# Patient Record
Sex: Female | Born: 1945 | Race: White | Hispanic: No | Smoking: Current every day smoker
Health system: Southern US, Community
[De-identification: ages and names within clinical notes are randomized; demographics above are authoritative.]

## PROBLEM LIST (undated history)

## (undated) DIAGNOSIS — F329 Major depressive disorder, single episode, unspecified: Secondary | ICD-10-CM

## (undated) DIAGNOSIS — M1990 Unspecified osteoarthritis, unspecified site: Secondary | ICD-10-CM

## (undated) DIAGNOSIS — C569 Malignant neoplasm of unspecified ovary: Secondary | ICD-10-CM

## (undated) DIAGNOSIS — F32A Depression, unspecified: Secondary | ICD-10-CM

## (undated) DIAGNOSIS — E049 Nontoxic goiter, unspecified: Secondary | ICD-10-CM

## (undated) HISTORY — PX: ROTATOR CUFF REPAIR: SHX139

## (undated) HISTORY — DX: Unspecified osteoarthritis, unspecified site: M19.90

## (undated) HISTORY — PX: ANKLE FRACTURE SURGERY: SHX122

## (undated) HISTORY — DX: Major depressive disorder, single episode, unspecified: F32.9

## (undated) HISTORY — DX: Malignant neoplasm of unspecified ovary: C56.9

## (undated) HISTORY — DX: Nontoxic goiter, unspecified: E04.9

## (undated) HISTORY — PX: LUMBAR FUSION: SHX111

## (undated) HISTORY — DX: Depression, unspecified: F32.A

---

## 2005-10-21 ENCOUNTER — Emergency Department: Payer: Self-pay | Admitting: Unknown Physician Specialty

## 2005-10-22 ENCOUNTER — Ambulatory Visit: Payer: Self-pay | Admitting: Unknown Physician Specialty

## 2006-02-03 ENCOUNTER — Inpatient Hospital Stay: Payer: Self-pay | Admitting: Internal Medicine

## 2006-02-03 ENCOUNTER — Other Ambulatory Visit: Payer: Self-pay

## 2006-10-27 ENCOUNTER — Ambulatory Visit: Payer: Self-pay | Admitting: Unknown Physician Specialty

## 2006-11-29 ENCOUNTER — Ambulatory Visit: Payer: Self-pay | Admitting: Unknown Physician Specialty

## 2006-12-02 ENCOUNTER — Ambulatory Visit: Payer: Self-pay | Admitting: Unknown Physician Specialty

## 2007-01-05 ENCOUNTER — Ambulatory Visit: Payer: Self-pay | Admitting: Surgery

## 2007-01-13 ENCOUNTER — Ambulatory Visit: Payer: Self-pay | Admitting: Surgery

## 2007-01-17 ENCOUNTER — Ambulatory Visit: Payer: Self-pay | Admitting: Surgery

## 2007-02-22 ENCOUNTER — Ambulatory Visit: Payer: Self-pay | Admitting: Surgery

## 2007-02-25 ENCOUNTER — Ambulatory Visit: Payer: Self-pay | Admitting: Surgery

## 2007-04-01 ENCOUNTER — Ambulatory Visit: Payer: Self-pay | Admitting: Unknown Physician Specialty

## 2007-07-29 ENCOUNTER — Observation Stay (HOSPITAL_COMMUNITY): Admission: RE | Admit: 2007-07-29 | Discharge: 2007-07-30 | Payer: Self-pay | Admitting: Neurosurgery

## 2007-08-17 ENCOUNTER — Encounter (HOSPITAL_COMMUNITY): Admission: RE | Admit: 2007-08-17 | Discharge: 2007-09-16 | Payer: Self-pay | Admitting: Neurosurgery

## 2007-12-09 ENCOUNTER — Ambulatory Visit: Payer: Self-pay | Admitting: Otolaryngology

## 2007-12-19 ENCOUNTER — Emergency Department (HOSPITAL_COMMUNITY): Admission: EM | Admit: 2007-12-19 | Discharge: 2007-12-19 | Payer: Self-pay | Admitting: Emergency Medicine

## 2007-12-19 ENCOUNTER — Emergency Department: Payer: Self-pay | Admitting: Emergency Medicine

## 2008-10-18 ENCOUNTER — Emergency Department (HOSPITAL_COMMUNITY): Admission: EM | Admit: 2008-10-18 | Discharge: 2008-10-18 | Payer: Self-pay | Admitting: Emergency Medicine

## 2009-06-06 ENCOUNTER — Ambulatory Visit: Payer: Self-pay | Admitting: Neurology

## 2009-09-12 ENCOUNTER — Ambulatory Visit: Payer: Self-pay | Admitting: Gastroenterology

## 2010-02-02 ENCOUNTER — Emergency Department: Payer: Self-pay | Admitting: Emergency Medicine

## 2010-05-07 ENCOUNTER — Ambulatory Visit: Payer: Self-pay | Admitting: Gastroenterology

## 2010-12-21 ENCOUNTER — Emergency Department (HOSPITAL_COMMUNITY)
Admission: EM | Admit: 2010-12-21 | Discharge: 2010-12-21 | Payer: Self-pay | Source: Home / Self Care | Admitting: Emergency Medicine

## 2011-05-05 NOTE — Op Note (Signed)
[name], [name]               ACCOUNT NO.:  [id number]   MEDICAL RECORD NO.:  [id number]          PATIENT TYPE:  INP   LOCATION:  3037                         FACILITY:  [facility]   PHYSICIAN:  Donalee Citrin, M.D.        DATE OF BIRTH:  11/08/1946   DATE OF PROCEDURE:  07/29/2007  DATE OF DISCHARGE:                               OPERATIVE REPORT   PREOPERATIVE DIAGNOSIS:  Recurrent ruptured disk, L5-S1, left.   PROCEDURE:  Re-do lumbar laminectomy, microdiskectomy, L5-S1, left,  microscopic dissection left S1 nerve root, microscopic diskectomy.   SURGEON:  Donalee Citrin, M.D.   ANESTHESIA:  General endotracheal.   HPI:  The patient is a very pleasant 65 year old female with  longstanding back pain.  She has undergone previously laminectomy in the  years past, where it had recurrence, back and left leg pain radiating  down the posterior thigh to the bottom of the foot and outside of the  foot.  Preoperative imaging showed a very large recurrent ruptured disk  at L5-S1.  The patient failed all forms of conservative treatment.  Had  some weakness on plantar flexion, on heel walk and toe walk  preoperatively, and after failure of conservative treatment was  recommended laminectomy, microdiskectomy.  Risks and benefits of the  operation were explained to the patient and she understands.   PROCEDURE IN DETAIL:  The patient was brought to the OR, was induced  under general anesthesia.  The back was prepped and draped in the usual  sterile fashion.  Her old incision was opened up, with no preoperative x-  ray.  Scar tissue was dissected free and subperiosteal dissection  carried out on the residual lamina of L5 and S1.  Intraoperative x-ray  confirmed localization at the appropriate level.  Scar tissue was then  dissected off of the residual lamina, identifying the inferior aspect  lamina of L5 and superior aspect of S1.  Then using 2 and 3 mm Kerrison  punch, after a 4 pin field was freed up,  the endocervix of that lamina,  the laminotomy was extended.  Medial facetectomy was extended, exposing  virgin dura both cephalad and caudally from the previous laminotomy  defect.  At this point, the operating microscope was draped and brought  onto the field.  Under microscopic illumination, additional scar tissue  was freed up off the thecal sac.  The S1 nerve was identified, was noted  to be densely adherent to a large disk fragment contained and displaced  in the thecal sac and S1 nerve root dorsally.  Using a blunt nerve hook,  the pedicle was identified and working on the medial border of the  pedicle, the S1 nerve root was dissected free and dissected off the  large disk herniation.  Then the S1 nerve root was reflected medial with  the D'Errico.  Annulotomy was made with an 11 blade scalpel and the disk  space was directly cleaned out.  Using a downgoing Epstein and pituitary  rongeur, the disk space was cleaned out, decompressing the thecal sac  and S1 nerve root.  At  the end of the diskectomy, there was no further  stenosis, explored with a coronary dilator and hockey stick.  Then the  wound was copiously irrigated and meticulous hemostasis was maintained.  Gelfoam was overlaid on top  of the dura.  The muscle and fascia were reapproximated in layers with  interrupted Vicryl and the skin was closed with running 4-0  subcuticular.  Benzoin and Steri-Strips were applied.  The patient was  sent to the recovery room in stable condition.  At the end of the case,  sponge and needle counts were correct.           ______________________________  Donalee Citrin, M.D.     GC/MEDQ  D:  07/29/2007  T:  07/30/2007  Job:  [id number]

## 2011-10-05 LAB — CBC
Platelets: 305
RDW: 13.1

## 2011-10-05 LAB — BASIC METABOLIC PANEL
Calcium: 9.8
Sodium: 137

## 2012-01-21 ENCOUNTER — Ambulatory Visit: Payer: Self-pay

## 2012-02-02 ENCOUNTER — Ambulatory Visit: Payer: Self-pay | Admitting: Gynecologic Oncology

## 2012-02-09 ENCOUNTER — Ambulatory Visit: Payer: Self-pay | Admitting: Gynecologic Oncology

## 2012-02-19 ENCOUNTER — Ambulatory Visit: Payer: Self-pay | Admitting: Gynecologic Oncology

## 2012-03-16 ENCOUNTER — Ambulatory Visit: Payer: Self-pay | Admitting: Obstetrics and Gynecology

## 2012-03-16 DIAGNOSIS — Z01810 Encounter for preprocedural cardiovascular examination: Secondary | ICD-10-CM

## 2012-03-16 LAB — BASIC METABOLIC PANEL
BUN: 7 mg/dL (ref 7–18)
Calcium, Total: 8.9 mg/dL (ref 8.5–10.1)
Chloride: 104 mmol/L (ref 98–107)
Co2: 28 mmol/L (ref 21–32)
EGFR (African American): >60
EGFR (Non-African Amer.): >60
Osmolality: 281 (ref 275–301)
Sodium: 142 mmol/L (ref 136–145)

## 2012-03-16 LAB — HEMOGLOBIN: HGB: 13.5 g/dL (ref 12.0–16.0)

## 2012-03-21 ENCOUNTER — Ambulatory Visit: Payer: Self-pay | Admitting: Gynecologic Oncology

## 2012-03-22 ENCOUNTER — Inpatient Hospital Stay: Payer: Self-pay | Admitting: Obstetrics and Gynecology

## 2012-03-23 LAB — CBC WITH DIFFERENTIAL/PLATELET
Basophil %: 0.4 %
Eosinophil %: 0.1 %
Lymphocyte %: 15.5 %
MCV: 91 fL (ref 80–100)
Monocyte %: 11 %
Neutrophil #: 8.3 10*3/uL — ABNORMAL HIGH (ref 1.4–6.5)
Neutrophil %: 73 %

## 2012-03-23 LAB — BASIC METABOLIC PANEL
BUN: 6 mg/dL — ABNORMAL LOW (ref 7–18)
Co2: 28 mmol/L (ref 21–32)
Creatinine: 0.71 mg/dL (ref 0.60–1.30)
EGFR (African American): >60
Glucose: 102 mg/dL — ABNORMAL HIGH (ref 65–99)
Sodium: 140 mmol/L (ref 136–145)

## 2012-03-24 LAB — BASIC METABOLIC PANEL
Anion Gap: 9 (ref 7–16)
Chloride: 104 mmol/L (ref 98–107)
Co2: 29 mmol/L (ref 21–32)
EGFR (African American): >60
EGFR (Non-African Amer.): >60
Osmolality: 280 (ref 275–301)
Sodium: 142 mmol/L (ref 136–145)

## 2012-03-24 LAB — CBC WITH DIFFERENTIAL/PLATELET
Basophil %: 0.1 %
Eosinophil #: 0.1 10*3/uL (ref 0.0–0.7)
Lymphocyte #: 1.6 10*3/uL (ref 1.0–3.6)
Lymphocyte %: 15.3 %
Neutrophil #: 7.5 10*3/uL — ABNORMAL HIGH (ref 1.4–6.5)
Neutrophil %: 72.6 %

## 2012-03-25 LAB — BASIC METABOLIC PANEL
BUN: 4 mg/dL — ABNORMAL LOW (ref 7–18)
Calcium, Total: 7.8 mg/dL — ABNORMAL LOW (ref 8.5–10.1)
Calcium, Total: 7.9 mg/dL — ABNORMAL LOW (ref 8.5–10.1)
Chloride: 103 mmol/L (ref 98–107)
Co2: 30 mmol/L (ref 21–32)
Co2: 30 mmol/L (ref 21–32)
EGFR (African American): >60
EGFR (Non-African Amer.): >60
EGFR (Non-African Amer.): >60
Glucose: 114 mg/dL — ABNORMAL HIGH (ref 65–99)
Glucose: 112 mg/dL — ABNORMAL HIGH (ref 65–99)
Osmolality: 281 (ref 275–301)
Potassium: 3.1 mmol/L — ABNORMAL LOW (ref 3.5–5.1)
Potassium: 3.4 mmol/L — ABNORMAL LOW (ref 3.5–5.1)
Sodium: 142 mmol/L (ref 136–145)

## 2012-03-25 LAB — CBC WITH DIFFERENTIAL/PLATELET
Basophil %: 0.3 %
Eosinophil %: 3.2 %
HCT: 34 % — ABNORMAL LOW (ref 35.0–47.0)
HGB: 11.4 g/dL — ABNORMAL LOW (ref 12.0–16.0)
Lymphocyte #: 1 10*3/uL (ref 1.0–3.6)
Lymphocyte %: 9.4 %
MCH: 30.4 pg (ref 26.0–34.0)
MCV: 91 fL (ref 80–100)
Monocyte #: 1.1 10*3/uL — ABNORMAL HIGH (ref 0.0–0.7)
Monocyte %: 9.4 %
Neutrophil #: 8.7 10*3/uL — ABNORMAL HIGH (ref 1.4–6.5)
Platelet: 265 10*3/uL (ref 150–440)
RDW: 13.2 % (ref 11.5–14.5)
WBC: 11.2 10*3/uL — ABNORMAL HIGH (ref 3.6–11.0)

## 2012-03-26 LAB — BASIC METABOLIC PANEL
Chloride: 101 mmol/L (ref 98–107)
Co2: 32 mmol/L (ref 21–32)
Creatinine: 0.6 mg/dL (ref 0.60–1.30)
EGFR (Non-African Amer.): >60
Potassium: 3.2 mmol/L — ABNORMAL LOW (ref 3.5–5.1)

## 2012-03-26 LAB — CBC WITH DIFFERENTIAL/PLATELET
Basophil #: 0 10*3/uL (ref 0.0–0.1)
Comment - H1-Com1: NORMAL
Comment - H1-Com2: NORMAL
Eosinophil %: 4.5 %
HCT: 31.3 % — ABNORMAL LOW (ref 35.0–47.0)
HGB: 10.6 g/dL — ABNORMAL LOW (ref 12.0–16.0)
MCH: 30.5 pg (ref 26.0–34.0)
MCHC: 34.1 g/dL (ref 32.0–36.0)
Monocyte #: 0.9 10*3/uL — ABNORMAL HIGH (ref 0.0–0.7)
Platelet: 265 10*3/uL (ref 150–440)
RDW: 13.1 % (ref 11.5–14.5)
WBC: 8.2 10*3/uL (ref 3.6–11.0)

## 2012-03-26 LAB — URINE CULTURE

## 2012-03-27 LAB — MAGNESIUM: Magnesium: 1.9 mg/dL

## 2012-03-27 LAB — BASIC METABOLIC PANEL
BUN: 4 mg/dL — ABNORMAL LOW (ref 7–18)
Calcium, Total: 8.2 mg/dL — ABNORMAL LOW (ref 8.5–10.1)
Chloride: 102 mmol/L (ref 98–107)
Co2: 29 mmol/L (ref 21–32)
Creatinine: 0.55 mg/dL — ABNORMAL LOW (ref 0.60–1.30)
EGFR (Non-African Amer.): >60

## 2012-03-27 LAB — CBC WITH DIFFERENTIAL/PLATELET
Basophil %: 0.2 %
Eosinophil #: 0.4 10*3/uL (ref 0.0–0.7)
Eosinophil %: 4.5 %
HCT: 31.8 % — ABNORMAL LOW (ref 35.0–47.0)
HGB: 10.8 g/dL — ABNORMAL LOW (ref 12.0–16.0)
Lymphocyte #: 1 10*3/uL (ref 1.0–3.6)
MCH: 30.5 pg (ref 26.0–34.0)
MCV: 90 fL (ref 80–100)
Monocyte #: 1.1 10*3/uL — ABNORMAL HIGH (ref 0.0–0.7)
Monocyte %: 11.8 %
Neutrophil #: 6.7 10*3/uL — ABNORMAL HIGH (ref 1.4–6.5)
RBC: 3.54 10*6/uL — ABNORMAL LOW (ref 3.80–5.20)
WBC: 9.2 10*3/uL (ref 3.6–11.0)

## 2012-03-29 LAB — CULTURE, BLOOD (SINGLE)

## 2012-03-30 ENCOUNTER — Ambulatory Visit: Payer: Self-pay | Admitting: Gynecologic Oncology

## 2012-04-20 ENCOUNTER — Ambulatory Visit: Payer: Self-pay | Admitting: Gynecologic Oncology

## 2012-04-26 ENCOUNTER — Ambulatory Visit: Payer: Self-pay | Admitting: Gynecologic Oncology

## 2012-04-26 LAB — CBC
HCT: 36.4 % (ref 35.0–47.0)
MCH: 30.1 pg (ref 26.0–34.0)
MCHC: 34 g/dL (ref 32.0–36.0)
MCV: 89 fL (ref 80–100)
Platelet: 257 10*3/uL (ref 150–440)
RBC: 4.12 10*6/uL (ref 3.80–5.20)
RDW: 13.6 % (ref 11.5–14.5)

## 2012-04-26 LAB — BASIC METABOLIC PANEL
Calcium, Total: 9 mg/dL (ref 8.5–10.1)
Chloride: 106 mmol/L (ref 98–107)
Creatinine: 0.63 mg/dL (ref 0.60–1.30)
EGFR (African American): >60
EGFR (Non-African Amer.): >60
Glucose: 95 mg/dL (ref 65–99)
Osmolality: 279 (ref 275–301)
Sodium: 141 mmol/L (ref 136–145)

## 2012-05-05 ENCOUNTER — Ambulatory Visit: Payer: Self-pay | Admitting: Gynecologic Oncology

## 2012-05-19 LAB — COMPREHENSIVE METABOLIC PANEL
Albumin: 3.7 g/dL (ref 3.4–5.0)
Anion Gap: 8 (ref 7–16)
Bilirubin,Total: 0.6 mg/dL (ref 0.2–1.0)
Calcium, Total: 8.9 mg/dL (ref 8.5–10.1)
Co2: 30 mmol/L (ref 21–32)
Creatinine: 0.59 mg/dL — ABNORMAL LOW (ref 0.60–1.30)
EGFR (Non-African Amer.): >60
Glucose: 95 mg/dL (ref 65–99)
Osmolality: 277 (ref 275–301)
Potassium: 3.8 mmol/L (ref 3.5–5.1)
SGPT (ALT): 15 U/L

## 2012-05-19 LAB — CBC CANCER CENTER
Basophil #: 0 x10 3/mm (ref 0.0–0.1)
Eosinophil %: 1.2 %
HCT: 38 % (ref 35.0–47.0)
HGB: 12.6 g/dL (ref 12.0–16.0)
Lymphocyte #: 2.2 x10 3/mm (ref 1.0–3.6)
Lymphocyte %: 33.1 %
MCH: 29.2 pg (ref 26.0–34.0)
MCV: 88 fL (ref 80–100)
Monocyte #: 0.6 x10 3/mm (ref 0.2–0.9)
Monocyte %: 8.8 %
Neutrophil #: 3.7 x10 3/mm (ref 1.4–6.5)
Platelet: 281 x10 3/mm (ref 150–440)
RBC: 4.32 10*6/uL (ref 3.80–5.20)
RDW: 13.6 % (ref 11.5–14.5)

## 2012-05-21 ENCOUNTER — Ambulatory Visit: Payer: Self-pay | Admitting: Gynecologic Oncology

## 2012-05-26 LAB — CBC CANCER CENTER
Basophil %: 0.2 %
Eosinophil #: 0.4 x10 3/mm (ref 0.0–0.7)
HGB: 12.3 g/dL (ref 12.0–16.0)
Lymphocyte #: 1.5 x10 3/mm (ref 1.0–3.6)
Lymphocyte %: 32.6 %
MCH: 29.5 pg (ref 26.0–34.0)
MCHC: 33.6 g/dL (ref 32.0–36.0)
Monocyte %: 2.4 %
Neutrophil #: 2.7 x10 3/mm (ref 1.4–6.5)
Neutrophil %: 57.3 %
RBC: 4.17 10*6/uL (ref 3.80–5.20)
WBC: 4.7 x10 3/mm (ref 3.6–11.0)

## 2012-06-02 LAB — CBC CANCER CENTER
Basophil #: 0 x10 3/mm (ref 0.0–0.1)
Eosinophil %: 2 %
HCT: 32.9 % — ABNORMAL LOW (ref 35.0–47.0)
HGB: 11.1 g/dL — ABNORMAL LOW (ref 12.0–16.0)
Lymphocyte #: 1.5 x10 3/mm (ref 1.0–3.6)
MCH: 29 pg (ref 26.0–34.0)
MCV: 86 fL (ref 80–100)
Monocyte #: 0.4 x10 3/mm (ref 0.2–0.9)
Neutrophil %: 8.7 %
Platelet: 157 x10 3/mm (ref 150–440)
RBC: 3.84 10*6/uL (ref 3.80–5.20)
RDW: 13.1 % (ref 11.5–14.5)
WBC: 2.2 x10 3/mm — ABNORMAL LOW (ref 3.6–11.0)

## 2012-06-02 LAB — BASIC METABOLIC PANEL
Calcium, Total: 9 mg/dL (ref 8.5–10.1)
Chloride: 102 mmol/L (ref 98–107)
Co2: 30 mmol/L (ref 21–32)
Creatinine: 0.63 mg/dL (ref 0.60–1.30)
EGFR (African American): >60
EGFR (Non-African Amer.): >60
Osmolality: 276 (ref 275–301)
Potassium: 3 mmol/L — ABNORMAL LOW (ref 3.5–5.1)

## 2012-06-02 LAB — HEPATIC FUNCTION PANEL A (ARMC)
Albumin: 3.5 g/dL (ref 3.4–5.0)
Bilirubin, Direct: 0.1 mg/dL (ref 0.00–0.20)
Bilirubin,Total: 0.3 mg/dL (ref 0.2–1.0)
SGPT (ALT): 19 U/L
Total Protein: 7 g/dL (ref 6.4–8.2)

## 2012-06-16 LAB — CBC CANCER CENTER
Basophil #: 0 x10 3/mm (ref 0.0–0.1)
Basophil %: 0 %
Eosinophil #: 0 x10 3/mm (ref 0.0–0.7)
Eosinophil %: 0 %
HCT: 35 % (ref 35.0–47.0)
HGB: 11.6 g/dL — ABNORMAL LOW (ref 12.0–16.0)
Lymphocyte %: 7.4 %
MCH: 29.2 pg (ref 26.0–34.0)
MCV: 88 fL (ref 80–100)
Monocyte #: 0.1 x10 3/mm — ABNORMAL LOW (ref 0.2–0.9)
Neutrophil #: 7.6 x10 3/mm — ABNORMAL HIGH (ref 1.4–6.5)
Neutrophil %: 91.3 %
RBC: 3.99 10*6/uL (ref 3.80–5.20)
RDW: 14.6 % — ABNORMAL HIGH (ref 11.5–14.5)
WBC: 8.4 x10 3/mm (ref 3.6–11.0)

## 2012-06-16 LAB — BASIC METABOLIC PANEL
Calcium, Total: 9.3 mg/dL (ref 8.5–10.1)
Chloride: 102 mmol/L (ref 98–107)
Creatinine: 0.78 mg/dL (ref 0.60–1.30)
EGFR (African American): >60
Glucose: 117 mg/dL — ABNORMAL HIGH (ref 65–99)
Osmolality: 282 (ref 275–301)
Potassium: 3.4 mmol/L — ABNORMAL LOW (ref 3.5–5.1)

## 2012-06-20 ENCOUNTER — Ambulatory Visit: Payer: Self-pay | Admitting: Gynecologic Oncology

## 2012-06-30 LAB — CBC CANCER CENTER
Basophil %: 0.4 %
Eosinophil #: 0 x10 3/mm (ref 0.0–0.7)
Eosinophil %: 0.9 %
HCT: 30.9 % — ABNORMAL LOW (ref 35.0–47.0)
HGB: 10.6 g/dL — ABNORMAL LOW (ref 12.0–16.0)
MCH: 30.5 pg (ref 26.0–34.0)
MCHC: 34.3 g/dL (ref 32.0–36.0)
MCV: 89 fL (ref 80–100)
Monocyte #: 0.4 x10 3/mm (ref 0.2–0.9)
Neutrophil #: 0.5 x10 3/mm — ABNORMAL LOW (ref 1.4–6.5)
Neutrophil %: 16 %
RBC: 3.48 10*6/uL — ABNORMAL LOW (ref 3.80–5.20)
WBC: 3 x10 3/mm — ABNORMAL LOW (ref 3.6–11.0)

## 2012-07-07 LAB — CBC CANCER CENTER
Eosinophil #: 0 x10 3/mm (ref 0.0–0.7)
HCT: 33.6 % — ABNORMAL LOW (ref 35.0–47.0)
Lymphocyte #: 0.7 x10 3/mm — ABNORMAL LOW (ref 1.0–3.6)
Lymphocyte %: 17.3 %
MCHC: 33.8 g/dL (ref 32.0–36.0)
MCV: 90 fL (ref 80–100)
Neutrophil #: 2.9 x10 3/mm (ref 1.4–6.5)
RDW: 17 % — ABNORMAL HIGH (ref 11.5–14.5)

## 2012-07-07 LAB — COMPREHENSIVE METABOLIC PANEL
Alkaline Phosphatase: 120 U/L (ref 50–136)
BUN: 5 mg/dL — ABNORMAL LOW (ref 7–18)
Bilirubin,Total: 0.3 mg/dL (ref 0.2–1.0)
Calcium, Total: 9.2 mg/dL (ref 8.5–10.1)
Chloride: 104 mmol/L (ref 98–107)
Co2: 27 mmol/L (ref 21–32)
EGFR (African American): >60
EGFR (Non-African Amer.): >60
Glucose: 70 mg/dL (ref 65–99)
Osmolality: 279 (ref 275–301)
Potassium: 3.4 mmol/L — ABNORMAL LOW (ref 3.5–5.1)
SGPT (ALT): 13 U/L
Sodium: 142 mmol/L (ref 136–145)

## 2012-07-14 LAB — CBC CANCER CENTER
Basophil #: 0 x10 3/mm (ref 0.0–0.1)
HCT: 30.8 % — ABNORMAL LOW (ref 35.0–47.0)
Lymphocyte #: 1.4 x10 3/mm (ref 1.0–3.6)
Lymphocyte %: 49.2 %
MCHC: 35.2 g/dL (ref 32.0–36.0)
Monocyte #: 0.2 x10 3/mm (ref 0.2–0.9)
Monocyte %: 5.4 %
Neutrophil #: 1.2 x10 3/mm — ABNORMAL LOW (ref 1.4–6.5)
Neutrophil %: 43.9 %
RDW: 17.3 % — ABNORMAL HIGH (ref 11.5–14.5)
WBC: 2.8 x10 3/mm — ABNORMAL LOW (ref 3.6–11.0)

## 2012-07-21 ENCOUNTER — Ambulatory Visit: Payer: Self-pay | Admitting: Gynecologic Oncology

## 2012-07-21 LAB — CBC CANCER CENTER
Basophil #: 0 x10 3/mm (ref 0.0–0.1)
HCT: 27.7 % — ABNORMAL LOW (ref 35.0–47.0)
HGB: 9.8 g/dL — ABNORMAL LOW (ref 12.0–16.0)
Lymphocyte %: 74.3 %
MCHC: 35.3 g/dL (ref 32.0–36.0)
Monocyte %: 17.8 %
Neutrophil #: 0.2 x10 3/mm — ABNORMAL LOW (ref 1.4–6.5)
RBC: 3.03 10*6/uL — ABNORMAL LOW (ref 3.80–5.20)
RDW: 17.8 % — ABNORMAL HIGH (ref 11.5–14.5)
WBC: 2.5 x10 3/mm — ABNORMAL LOW (ref 3.6–11.0)

## 2012-07-28 LAB — CBC CANCER CENTER
Basophil %: 0.1 %
Eosinophil %: 0.2 %
HGB: 10 g/dL — ABNORMAL LOW (ref 12.0–16.0)
Lymphocyte %: 38 %
MCH: 32.6 pg (ref 26.0–34.0)
Monocyte #: 0.3 x10 3/mm (ref 0.2–0.9)
Monocyte %: 7.4 %
Neutrophil %: 54.3 %
WBC: 4.2 x10 3/mm (ref 3.6–11.0)

## 2012-08-04 LAB — CBC CANCER CENTER
Basophil #: 0 x10 3/mm (ref 0.0–0.1)
Eosinophil %: 0 %
HCT: 31.4 % — ABNORMAL LOW (ref 35.0–47.0)
HGB: 10.6 g/dL — ABNORMAL LOW (ref 12.0–16.0)
MCH: 32.5 pg (ref 26.0–34.0)
MCHC: 33.8 g/dL (ref 32.0–36.0)
MCV: 96 fL (ref 80–100)
Monocyte %: 2.6 %
RBC: 3.27 10*6/uL — ABNORMAL LOW (ref 3.80–5.20)

## 2012-08-04 LAB — COMPREHENSIVE METABOLIC PANEL
Albumin: 3.9 g/dL (ref 3.4–5.0)
BUN: 6 mg/dL — ABNORMAL LOW (ref 7–18)
Chloride: 103 mmol/L (ref 98–107)
Creatinine: 0.64 mg/dL (ref 0.60–1.30)
Potassium: 3.3 mmol/L — ABNORMAL LOW (ref 3.5–5.1)

## 2012-08-11 LAB — CBC CANCER CENTER
Basophil %: 0.2 %
Eosinophil #: 0 x10 3/mm (ref 0.0–0.7)
Lymphocyte #: 1 x10 3/mm (ref 1.0–3.6)
MCH: 33.9 pg (ref 26.0–34.0)
MCHC: 35.2 g/dL (ref 32.0–36.0)
MCV: 96 fL (ref 80–100)
Monocyte #: 0.1 x10 3/mm — ABNORMAL LOW (ref 0.2–0.9)
Neutrophil %: 56.4 %
Platelet: 256 x10 3/mm (ref 150–440)
RBC: 2.73 10*6/uL — ABNORMAL LOW (ref 3.80–5.20)

## 2012-08-18 LAB — CBC CANCER CENTER
Basophil #: 0 x10 3/mm (ref 0.0–0.1)
Eosinophil %: 0.4 %
MCH: 34.3 pg — ABNORMAL HIGH (ref 26.0–34.0)
Monocyte #: 0.5 x10 3/mm (ref 0.2–0.9)
Neutrophil %: 10.4 %
Platelet: 113 x10 3/mm — ABNORMAL LOW (ref 150–440)
RBC: 2.62 10*6/uL — ABNORMAL LOW (ref 3.80–5.20)
RDW: 19.5 % — ABNORMAL HIGH (ref 11.5–14.5)
WBC: 2.6 x10 3/mm — ABNORMAL LOW (ref 3.6–11.0)

## 2012-08-21 ENCOUNTER — Ambulatory Visit: Payer: Self-pay | Admitting: Gynecologic Oncology

## 2012-08-25 LAB — CBC CANCER CENTER
Basophil #: 0 x10 3/mm (ref 0.0–0.1)
Eosinophil %: 0.4 %
HGB: 8.7 g/dL — ABNORMAL LOW (ref 12.0–16.0)
Lymphocyte #: 2 x10 3/mm (ref 1.0–3.6)
Lymphocyte %: 43.4 %
MCH: 34.4 pg — ABNORMAL HIGH (ref 26.0–34.0)
MCHC: 33.9 g/dL (ref 32.0–36.0)
Monocyte #: 0.4 x10 3/mm (ref 0.2–0.9)
Neutrophil #: 2.2 x10 3/mm (ref 1.4–6.5)
Neutrophil %: 48.2 %
Platelet: 47 x10 3/mm — ABNORMAL LOW (ref 150–440)

## 2012-09-20 ENCOUNTER — Ambulatory Visit: Payer: Self-pay | Admitting: Gynecologic Oncology

## 2012-10-06 DIAGNOSIS — F17200 Nicotine dependence, unspecified, uncomplicated: Secondary | ICD-10-CM | POA: Insufficient documentation

## 2012-10-06 DIAGNOSIS — Z720 Tobacco use: Secondary | ICD-10-CM | POA: Insufficient documentation

## 2012-10-28 DIAGNOSIS — R0602 Shortness of breath: Secondary | ICD-10-CM | POA: Insufficient documentation

## 2012-11-03 ENCOUNTER — Other Ambulatory Visit: Payer: Self-pay | Admitting: Oncology

## 2012-11-03 LAB — CREATININE, SERUM
Creatinine: 0.61 mg/dL (ref 0.60–1.30)
EGFR (African American): >60

## 2012-11-04 ENCOUNTER — Ambulatory Visit: Payer: Self-pay | Admitting: Oncology

## 2012-11-07 ENCOUNTER — Ambulatory Visit: Payer: Self-pay | Admitting: Oncology

## 2012-11-07 LAB — COMPREHENSIVE METABOLIC PANEL
Albumin: 3.8 g/dL (ref 3.4–5.0)
Alkaline Phosphatase: 108 U/L (ref 50–136)
Calcium, Total: 9.4 mg/dL (ref 8.5–10.1)
EGFR (Non-African Amer.): >60
Glucose: 103 mg/dL — ABNORMAL HIGH (ref 65–99)
Osmolality: 278 (ref 275–301)
SGOT(AST): 23 U/L (ref 15–37)
SGPT (ALT): 19 U/L (ref 12–78)
Sodium: 140 mmol/L (ref 136–145)

## 2012-11-07 LAB — CBC CANCER CENTER
Basophil %: 0.7 %
Eosinophil %: 0.7 %
HCT: 35.9 % (ref 35.0–47.0)
HGB: 11.9 g/dL — ABNORMAL LOW (ref 12.0–16.0)
Lymphocyte #: 2.3 x10 3/mm (ref 1.0–3.6)
MCH: 33.1 pg (ref 26.0–34.0)
MCV: 100 fL (ref 80–100)
Monocyte #: 0.4 x10 3/mm (ref 0.2–0.9)
RBC: 3.61 10*6/uL — ABNORMAL LOW (ref 3.80–5.20)
WBC: 6.4 x10 3/mm (ref 3.6–11.0)

## 2012-11-08 LAB — CA 125: CA 125: 12.1 U/mL (ref 0.0–34.0)

## 2012-11-09 DIAGNOSIS — C541 Malignant neoplasm of endometrium: Secondary | ICD-10-CM | POA: Insufficient documentation

## 2012-11-20 ENCOUNTER — Ambulatory Visit: Payer: Self-pay | Admitting: Oncology

## 2012-12-21 ENCOUNTER — Ambulatory Visit: Payer: Self-pay | Admitting: Oncology

## 2013-01-21 ENCOUNTER — Ambulatory Visit: Payer: Self-pay | Admitting: Oncology

## 2013-02-13 ENCOUNTER — Ambulatory Visit: Payer: Self-pay | Admitting: Oncology

## 2013-02-18 ENCOUNTER — Ambulatory Visit: Payer: Self-pay | Admitting: Gynecologic Oncology

## 2013-02-28 LAB — COMPREHENSIVE METABOLIC PANEL
Albumin: 3.9 g/dL (ref 3.4–5.0)
Anion Gap: 7 (ref 7–16)
Bilirubin,Total: 0.6 mg/dL (ref 0.2–1.0)
Calcium, Total: 9.3 mg/dL (ref 8.5–10.1)
Chloride: 101 mmol/L (ref 98–107)
Co2: 32 mmol/L (ref 21–32)
Creatinine: 0.83 mg/dL (ref 0.60–1.30)
EGFR (Non-African Amer.): >60
Glucose: 89 mg/dL (ref 65–99)
Osmolality: 278 (ref 275–301)
Potassium: 3.7 mmol/L (ref 3.5–5.1)
SGOT(AST): 19 U/L (ref 15–37)
SGPT (ALT): 13 U/L (ref 12–78)
Sodium: 140 mmol/L (ref 136–145)
Total Protein: 7.4 g/dL (ref 6.4–8.2)

## 2013-02-28 LAB — CBC CANCER CENTER
Eosinophil %: 0.9 %
HCT: 38.4 % (ref 35.0–47.0)
HGB: 13.2 g/dL (ref 12.0–16.0)
Monocyte %: 9.7 %
Neutrophil #: 3.5 x10 3/mm (ref 1.4–6.5)
Neutrophil %: 57.3 %
RBC: 4.18 10*6/uL (ref 3.80–5.20)
RDW: 13.6 % (ref 11.5–14.5)
WBC: 6.2 x10 3/mm (ref 3.6–11.0)

## 2013-03-21 ENCOUNTER — Ambulatory Visit: Payer: Self-pay | Admitting: Gynecologic Oncology

## 2013-04-04 IMAGING — CR DG CHEST 2V
1 series · 2 of 2 positions shown · non-contrast
Comparison: none

REASON FOR EXAM: Follow up to assess response to chest PT suspected
mucous plugging/atelectasis i
COMMENTS:

PROCEDURE:     DXR - DXR CHEST PA (OR AP) AND LATERAL  - March 25, 2012  [DATE]
RESULT:     Comparison is made to a prior study dated 03/24/2012.

[Series 1: pa · 0.17mm/px · 2 of 2 slices shown]
[im 1/2]
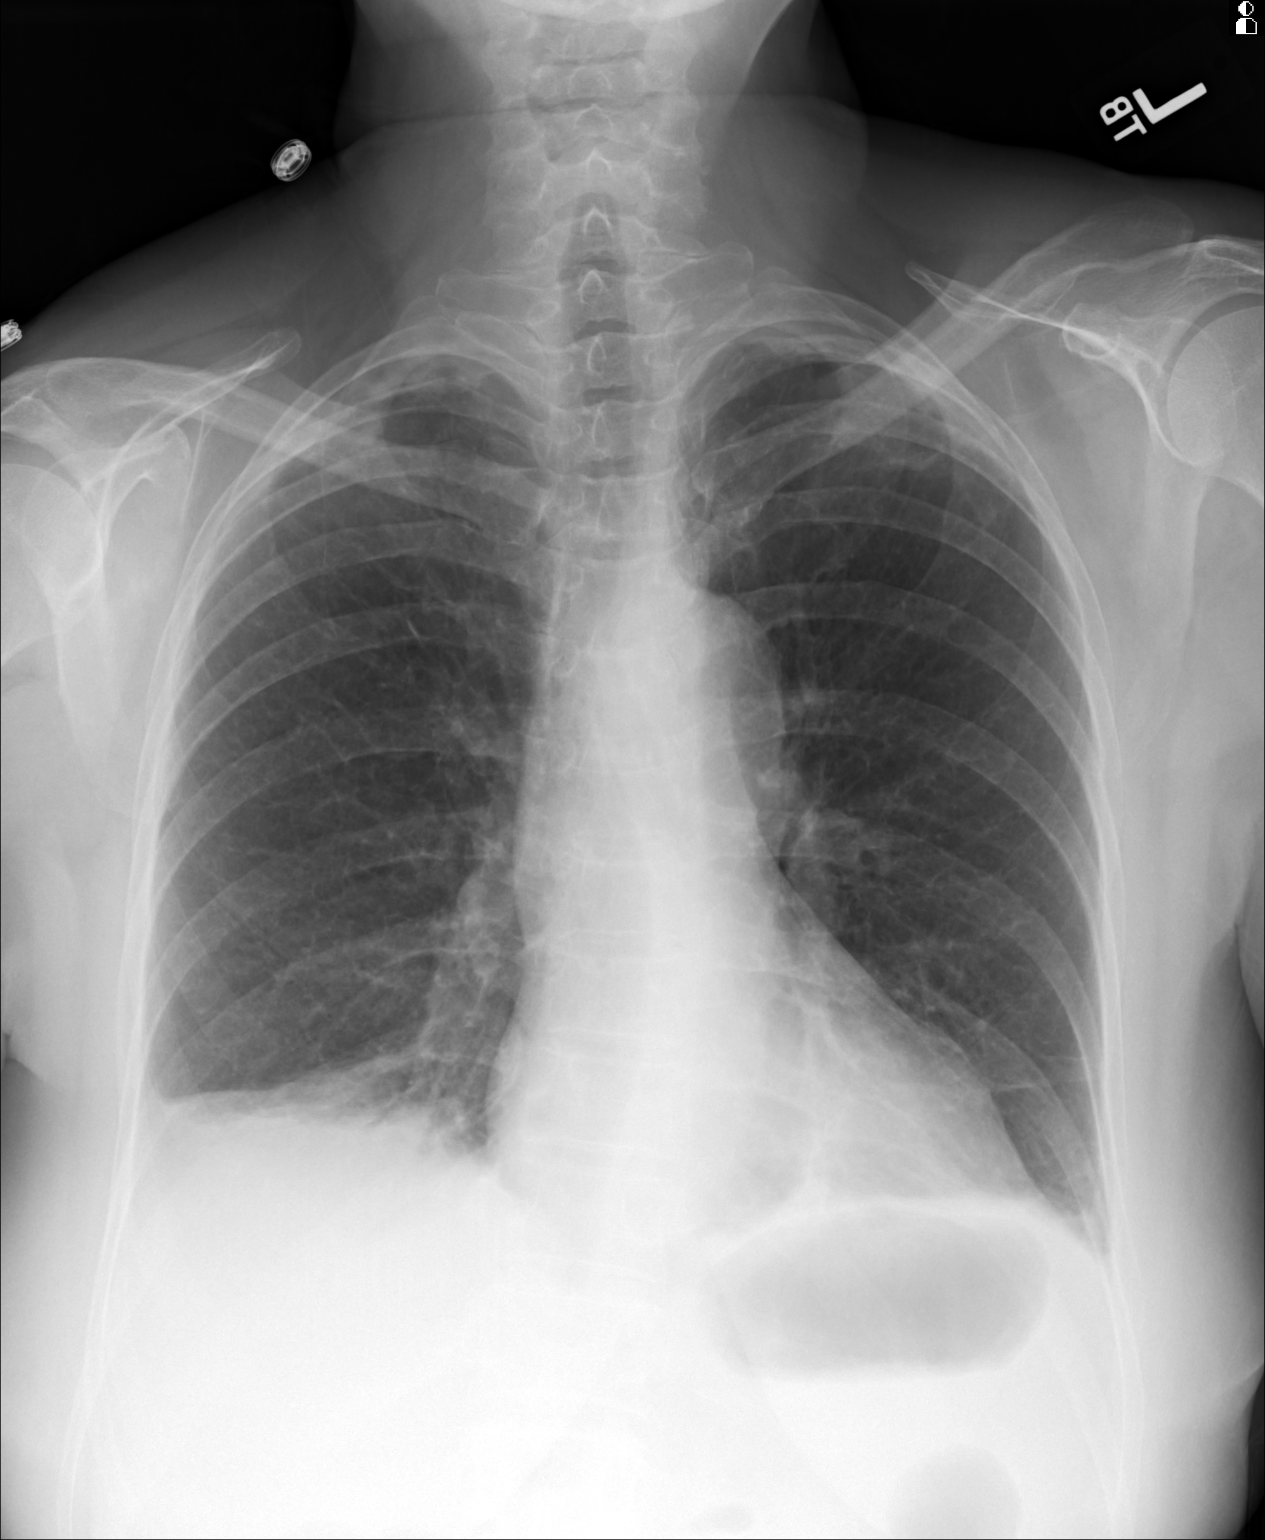
[im 2/2]
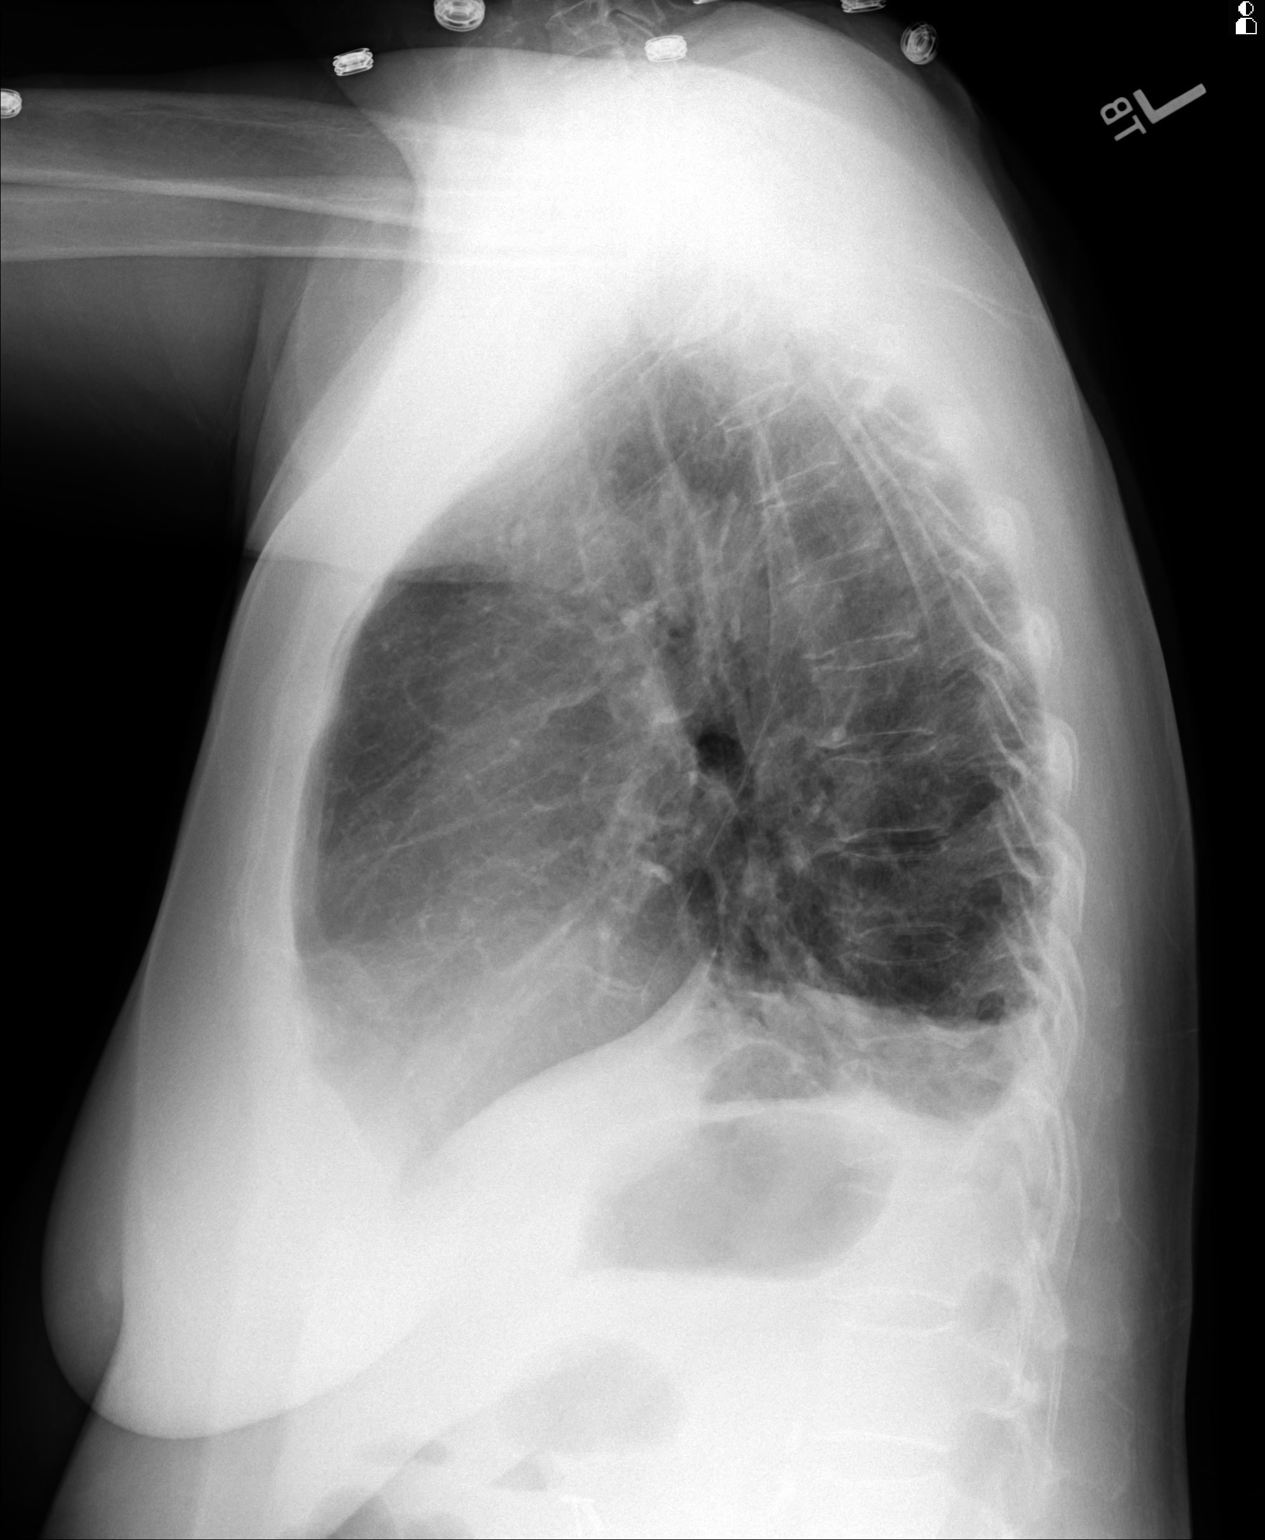

[2 of 2 positions shown; findings below may reference images not displayed]

FINDINGS: Area of increased density projects in the right lung base. There
is blunting of the costophrenic angle. The right lung base density has a
consolidative component. The cardiac silhouette and visualized bony skeleton
are unremarkable.
IMPRESSION: Atelectasis versus infiltrate with possibly a component of effusion right
lung base. Surveillance evaluation is recommended.

## 2013-04-04 IMAGING — CT CT ABD-PELV W/ CM
1 of 3 series · 12 of 32 positions shown, 18 images · non-contrast
Comparison: none

REASON FOR EXAM: (1) Increased HAUN output in setting of right mid-ureteral
transection recognized
COMMENTS:

[Series 2: 3mm soft tissue · axial · 0.64mm/px · z∈[-595,-145]mm · 12 of 176 slices shown, 18 images]
[im 13/176  soft-tissue]
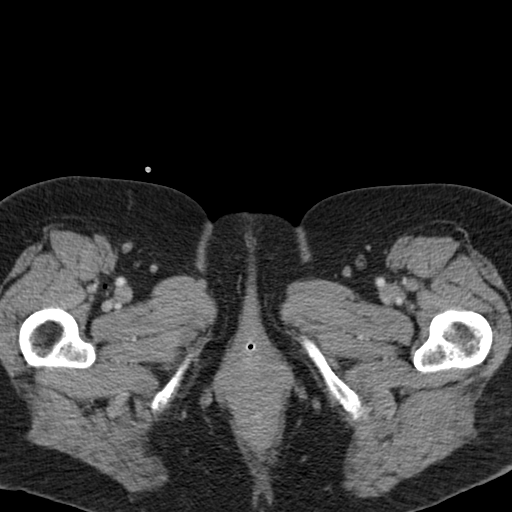
[im 13/176  bone]
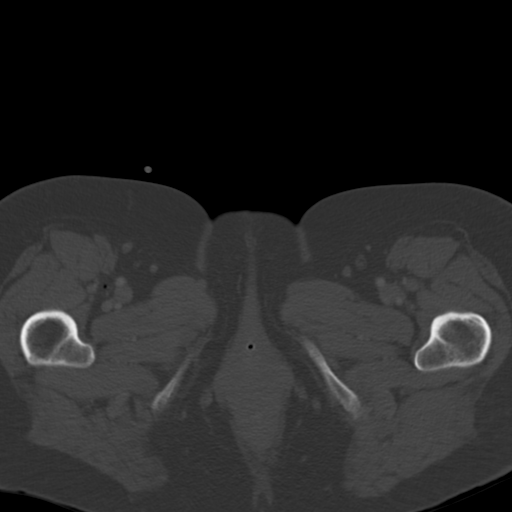
[im 26/176  soft-tissue]
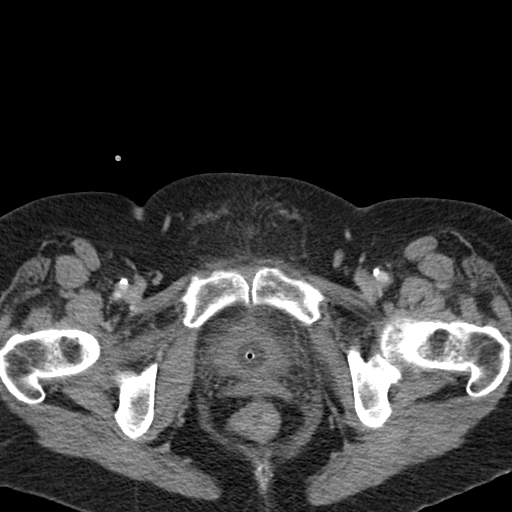
[im 38/176  soft-tissue]
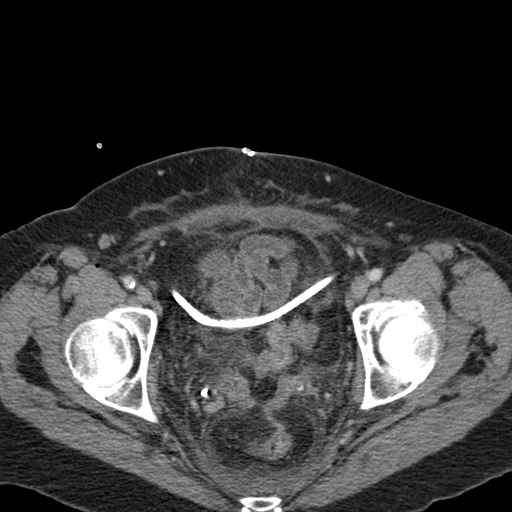
[im 51/176  soft-tissue]
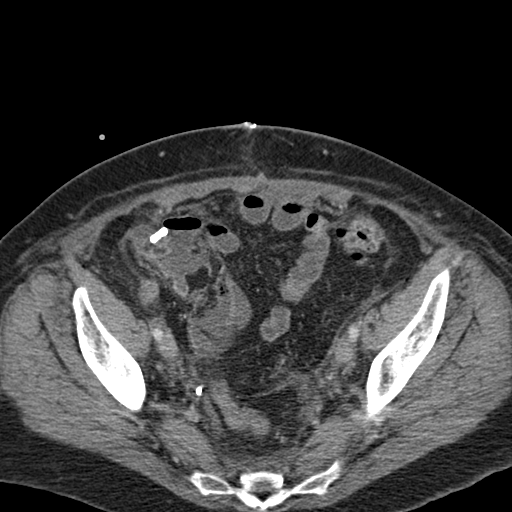
[im 63/176  soft-tissue]
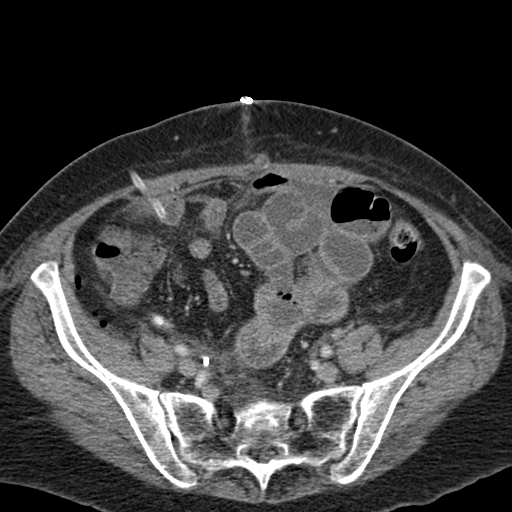
[im 76/176  soft-tissue]
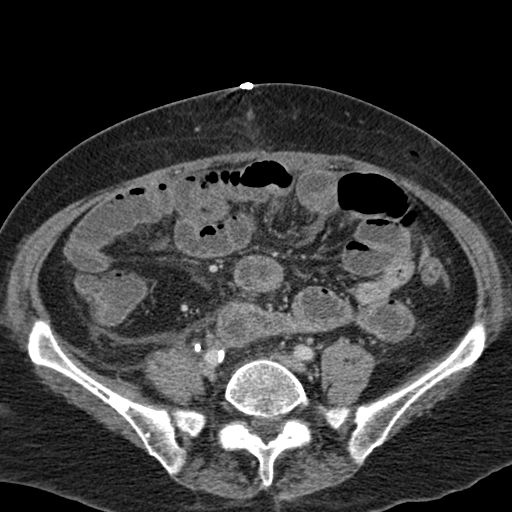
[im 101/176  soft-tissue]
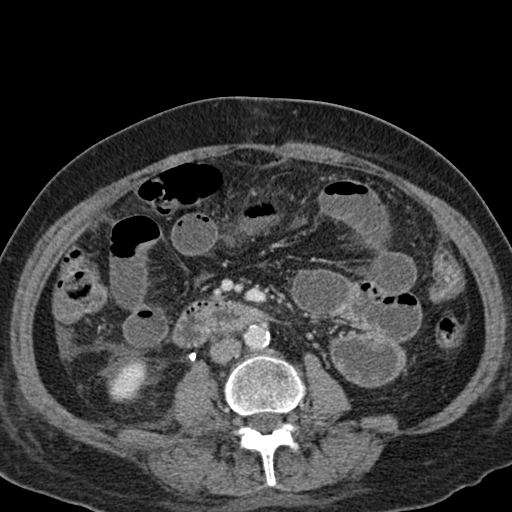
[im 113/176  soft-tissue]
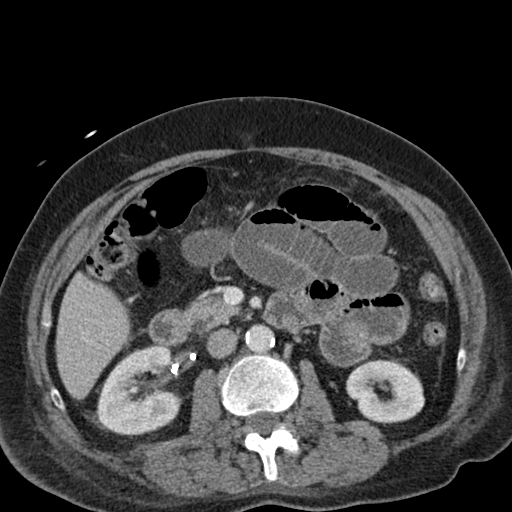
[im 126/176  soft-tissue]
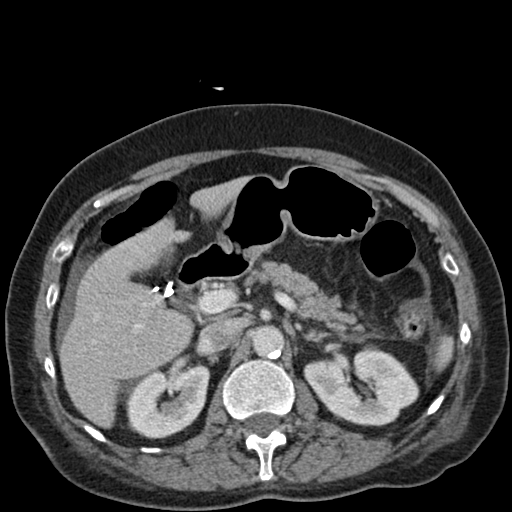
[im 126/176  lung]
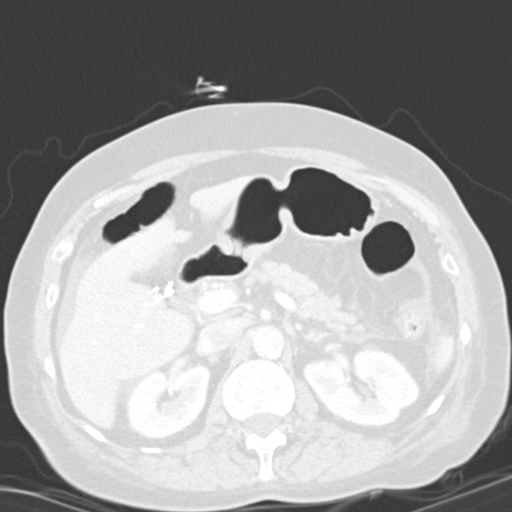
[im 126/176  bone]
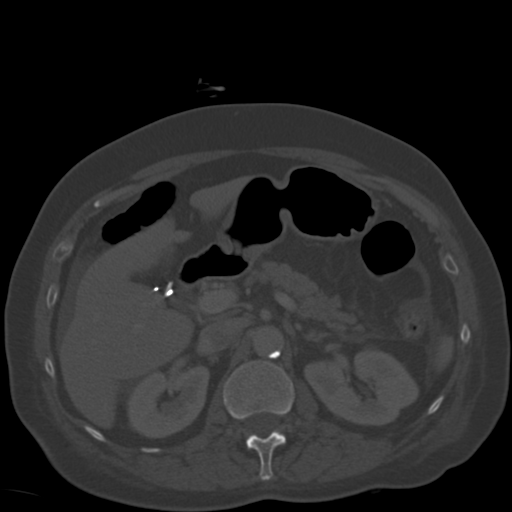
[im 138/176  soft-tissue]
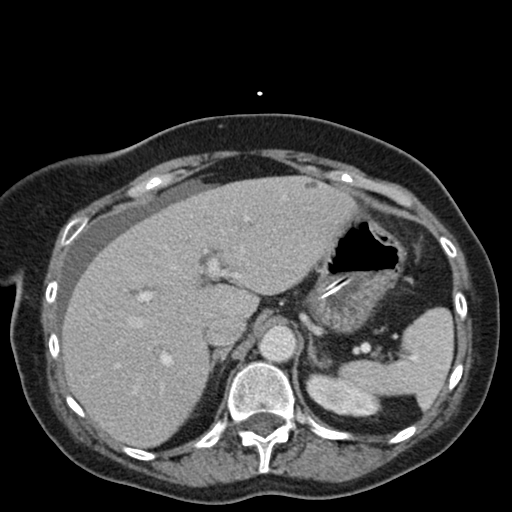
[im 138/176  lung]
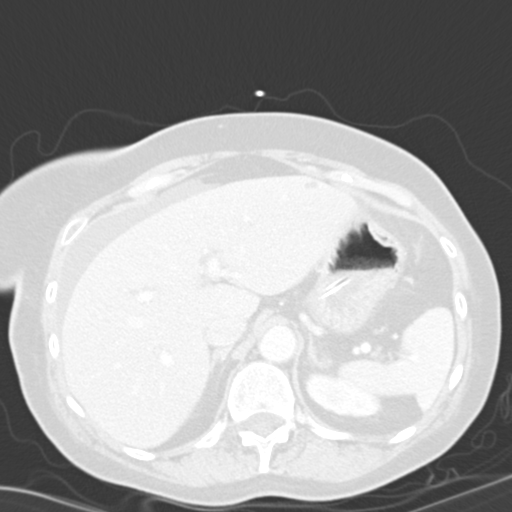
[im 151/176  soft-tissue]
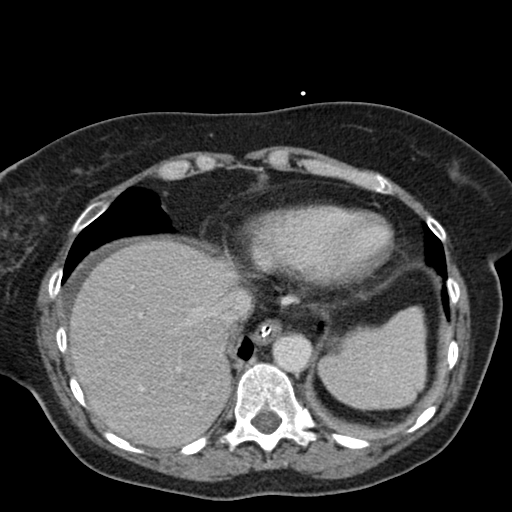
[im 151/176  lung]
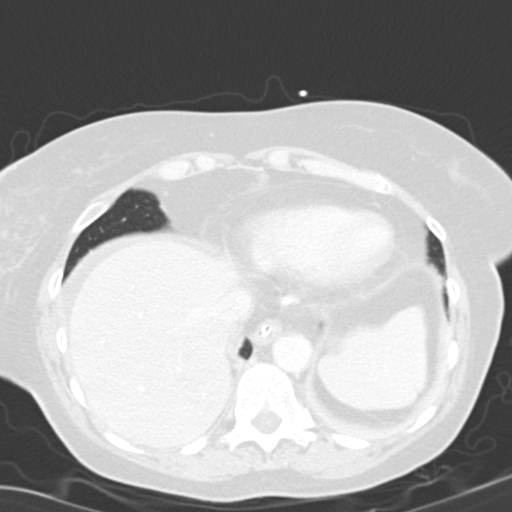
[im 163/176  soft-tissue]
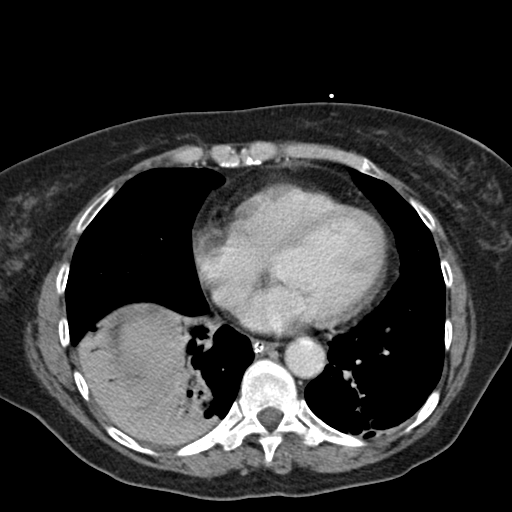
[im 163/176  lung]
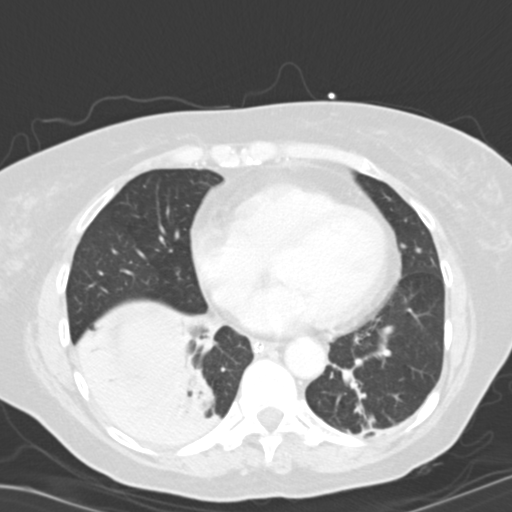

[12 of 32 positions shown; findings below may reference images not displayed]

PROCEDURE:     CT  - CT ABDOMEN / PELVIS  W  - March 25, 2012 [DATE]

RESULT:     Axial CT scanning was performed through the abdomen and pelvis
with reconstructions at 3 mm intervals and slice thicknesses. The patient
received 100 cc of [id number] and also received oral contrast material.
Delayed imaging was performed.

The patient has a double-J right ureteral stent. I do not see evidence of
leakage of contrast from the stented ureter. The left ureter is fractionally
visualized and appears normal. Neither kidney exhibits hydronephrosis. The
partially distended urinary bladder contains a Foley catheter balloon as
well a small amount of air. There is air in the perivesical soft tissues
from the recent abdominal procedure. There is a abdominal drainage catheter
present whose tip lies in the left aspect of the upper pelvis. It enters the
abdomen through the right lower anterior abdominal wall.

There is ascites present. The gallbladder is surgically absent. There is a
hypodensity in the left lobe of the liver most compatible with a cyst. This
measures approximately 8 mm in diameter and has Hounsfield measurement of
-3. There is a trace of intrahepatic ductal dilation. The spleen is not
enlarged. The pancreas, nondistended stomach, and adrenal glands are normal
in appearance. There is a nasogastric tube present whose tip lies in the
gastric body. The caliber of the abdominal aorta is normal.

There are loops of moderately distended fluid-filled small bowel throughout
the abdomen. The colon is partially distended with gas through the ascending
and transverse portions but the descending colon is relatively collapsed.
There is an abdominal wall defect from the previous surgical incision. I see
no discrete adnexal mass nor bulky intra-abdominal nor pelvic lymph nodes.
The caliber of the abdominal aorta is normal. There is a structure which may
reflect a gastric diverticulum lying just above the left adrenal gland. It
is closely applied to the posterior wall of the stomach. It appears to be
separate from the adrenal but this cannot be stated with absolute certainty.

The lung bases exhibit emphysematous changes. In addition there is bibasilar
atelectasis in the posterior costophrenic gutters.
IMPRESSION: 1. I do not see evidence of a ureteral leak on the right from the stented
ureter. Neither kidney exhibits evidence of obstruction.
2. Exophytic from the posterior aspect of the stomach there is a cystic
appearing structure which measures 2.6 cm in greatest dimension. This is
closely applied to the upper aspect of the adrenal gland and the adjacent
spleen but appears to be distinct from the structures. This was seen on a CT
scan of 1222 at which time a small amount of gas and contrast were present
within it. This is felt the most likely a gastric diverticulum.
3. There is ascites present.
4. There is mild distention of small bowel loops which likely reflects a
postoperative ileus.
5. There are post operative changes in the perivesical soft tissues from the
recent hysterectomy.
6. There are bibasilar atelectatic changes in the lungs.

## 2013-05-21 ENCOUNTER — Ambulatory Visit: Payer: Self-pay | Admitting: Oncology

## 2013-06-14 LAB — CA 125: CA 125: 8.7 U/mL (ref 0.0–34.0)

## 2013-06-20 ENCOUNTER — Ambulatory Visit: Payer: Self-pay | Admitting: Oncology

## 2013-10-03 ENCOUNTER — Ambulatory Visit: Payer: Self-pay | Admitting: Oncology

## 2013-10-21 ENCOUNTER — Ambulatory Visit: Payer: Self-pay | Admitting: Oncology

## 2013-11-03 LAB — BASIC METABOLIC PANEL
Anion Gap: 6 — ABNORMAL LOW (ref 7–16)
BUN: 10 mg/dL (ref 7–18)
Calcium, Total: 9.4 mg/dL (ref 8.5–10.1)
Co2: 32 mmol/L (ref 21–32)
Creatinine: 0.71 mg/dL (ref 0.60–1.30)
EGFR (African American): >60
EGFR (Non-African Amer.): >60
Glucose: 98 mg/dL (ref 65–99)

## 2013-11-20 ENCOUNTER — Ambulatory Visit: Payer: Self-pay | Admitting: Oncology

## 2013-12-05 DIAGNOSIS — M1990 Unspecified osteoarthritis, unspecified site: Secondary | ICD-10-CM | POA: Insufficient documentation

## 2013-12-19 ENCOUNTER — Ambulatory Visit: Payer: Self-pay

## 2014-03-07 ENCOUNTER — Ambulatory Visit: Payer: Self-pay | Admitting: Oncology

## 2014-03-07 LAB — COMPREHENSIVE METABOLIC PANEL
ALBUMIN: 4.1 g/dL (ref 3.4–5.0)
ALT: 10 U/L — AB (ref 12–78)
ANION GAP: 8 (ref 7–16)
Alkaline Phosphatase: 109 U/L
BUN: 11 mg/dL (ref 7–18)
Bilirubin,Total: 0.4 mg/dL (ref 0.2–1.0)
CREATININE: 0.88 mg/dL (ref 0.60–1.30)
Calcium, Total: 9.7 mg/dL (ref 8.5–10.1)
Chloride: 101 mmol/L (ref 98–107)
Co2: 30 mmol/L (ref 21–32)
EGFR (African American): >60
EGFR (Non-African Amer.): >60
Glucose: 87 mg/dL (ref 65–99)
OSMOLALITY: 276 (ref 275–301)
POTASSIUM: 3.9 mmol/L (ref 3.5–5.1)
SGOT(AST): 21 U/L (ref 15–37)
Sodium: 139 mmol/L (ref 136–145)
Total Protein: 7.7 g/dL (ref 6.4–8.2)

## 2014-03-07 LAB — CBC CANCER CENTER
Basophil #: 0.1 x10 3/mm (ref 0.0–0.1)
Basophil %: 0.7 %
EOS ABS: 0.1 x10 3/mm (ref 0.0–0.7)
Eosinophil %: 1.3 %
HCT: 38.9 % (ref 35.0–47.0)
HGB: 12.9 g/dL (ref 12.0–16.0)
Lymphocyte #: 2.2 x10 3/mm (ref 1.0–3.6)
Lymphocyte %: 28.4 %
MCH: 30.3 pg (ref 26.0–34.0)
MCHC: 33.3 g/dL (ref 32.0–36.0)
MCV: 91 fL (ref 80–100)
MONOS PCT: 8 %
Monocyte #: 0.6 x10 3/mm (ref 0.2–0.9)
Neutrophil #: 4.7 x10 3/mm (ref 1.4–6.5)
Neutrophil %: 61.6 %
Platelet: 322 x10 3/mm (ref 150–440)
RBC: 4.27 10*6/uL (ref 3.80–5.20)
RDW: 13.4 % (ref 11.5–14.5)
WBC: 7.6 x10 3/mm (ref 3.6–11.0)

## 2014-03-21 ENCOUNTER — Ambulatory Visit: Payer: Self-pay | Admitting: Oncology

## 2014-04-20 ENCOUNTER — Ambulatory Visit: Payer: Self-pay | Admitting: Oncology

## 2014-06-20 ENCOUNTER — Ambulatory Visit: Payer: Self-pay | Admitting: Oncology

## 2014-06-20 LAB — CBC CANCER CENTER
BASOS ABS: 0 x10 3/mm (ref 0.0–0.1)
Basophil %: 0.4 %
EOS ABS: 0.1 x10 3/mm (ref 0.0–0.7)
Eosinophil %: 1.3 %
HCT: 38.2 % (ref 35.0–47.0)
HGB: 12.7 g/dL (ref 12.0–16.0)
LYMPHS ABS: 2.4 x10 3/mm (ref 1.0–3.6)
LYMPHS PCT: 33.2 %
MCH: 30.2 pg (ref 26.0–34.0)
MCHC: 33.2 g/dL (ref 32.0–36.0)
MCV: 91 fL (ref 80–100)
MONO ABS: 0.6 x10 3/mm (ref 0.2–0.9)
MONOS PCT: 8.8 %
NEUTROS ABS: 4.1 x10 3/mm (ref 1.4–6.5)
NEUTROS PCT: 56.3 %
Platelet: 323 x10 3/mm (ref 150–440)
RBC: 4.19 10*6/uL (ref 3.80–5.20)
RDW: 13.4 % (ref 11.5–14.5)
WBC: 7.4 x10 3/mm (ref 3.6–11.0)

## 2014-06-20 LAB — COMPREHENSIVE METABOLIC PANEL
ALK PHOS: 94 U/L
ANION GAP: 9 (ref 7–16)
Albumin: 4 g/dL (ref 3.4–5.0)
BILIRUBIN TOTAL: 0.3 mg/dL (ref 0.2–1.0)
BUN: 11 mg/dL (ref 7–18)
CALCIUM: 9.3 mg/dL (ref 8.5–10.1)
Chloride: 102 mmol/L (ref 98–107)
Co2: 29 mmol/L (ref 21–32)
Creatinine: 0.89 mg/dL (ref 0.60–1.30)
EGFR (African American): >60
EGFR (Non-African Amer.): >60
Glucose: 83 mg/dL (ref 65–99)
Osmolality: 278 (ref 275–301)
Potassium: 3.7 mmol/L (ref 3.5–5.1)
SGOT(AST): 18 U/L (ref 15–37)
SGPT (ALT): 13 U/L (ref 12–78)
Sodium: 140 mmol/L (ref 136–145)
Total Protein: 7.5 g/dL (ref 6.4–8.2)

## 2014-06-21 LAB — CA 125: CA 125: 6.3 U/mL (ref 0.0–34.0)

## 2014-07-21 ENCOUNTER — Ambulatory Visit: Payer: Self-pay | Admitting: Oncology

## 2014-08-14 ENCOUNTER — Ambulatory Visit: Payer: Self-pay | Admitting: Obstetrics and Gynecology

## 2014-10-10 ENCOUNTER — Ambulatory Visit: Payer: Self-pay | Admitting: Oncology

## 2014-10-11 LAB — CA 125: CA 125: 8 U/mL (ref 0.0–34.0)

## 2014-10-21 ENCOUNTER — Ambulatory Visit: Payer: Self-pay | Admitting: Oncology

## 2014-11-29 DIAGNOSIS — E782 Mixed hyperlipidemia: Secondary | ICD-10-CM | POA: Insufficient documentation

## 2015-01-07 ENCOUNTER — Ambulatory Visit: Payer: Self-pay | Admitting: Oncology

## 2015-01-08 LAB — COMPREHENSIVE METABOLIC PANEL
ANION GAP: 14 (ref 7–16)
Albumin: 4.1 g/dL (ref 3.4–5.0)
Alkaline Phosphatase: 92 U/L
BUN: 12 mg/dL (ref 7–18)
Bilirubin,Total: 0.3 mg/dL (ref 0.2–1.0)
CHLORIDE: 102 mmol/L (ref 98–107)
Calcium, Total: 9.1 mg/dL (ref 8.5–10.1)
Co2: 25 mmol/L (ref 21–32)
Creatinine: 0.84 mg/dL (ref 0.60–1.30)
EGFR (Non-African Amer.): >60
GLUCOSE: 85 mg/dL (ref 65–99)
OSMOLALITY: 280 (ref 275–301)
POTASSIUM: 3.6 mmol/L (ref 3.5–5.1)
SGOT(AST): 16 U/L (ref 15–37)
SGPT (ALT): 11 U/L — ABNORMAL LOW
Sodium: 141 mmol/L (ref 136–145)
TOTAL PROTEIN: 7.4 g/dL (ref 6.4–8.2)

## 2015-01-08 LAB — CBC CANCER CENTER
BASOS ABS: 0 x10 3/mm (ref 0.0–0.1)
Basophil %: 0.4 %
Eosinophil #: 0.1 x10 3/mm (ref 0.0–0.7)
Eosinophil %: 1.5 %
HCT: 39.6 % (ref 35.0–47.0)
HGB: 13.3 g/dL (ref 12.0–16.0)
LYMPHS ABS: 2.3 x10 3/mm (ref 1.0–3.6)
Lymphocyte %: 29.7 %
MCH: 30.3 pg (ref 26.0–34.0)
MCHC: 33.7 g/dL (ref 32.0–36.0)
MCV: 90 fL (ref 80–100)
MONOS PCT: 7.9 %
Monocyte #: 0.6 x10 3/mm (ref 0.2–0.9)
NEUTROS PCT: 60.5 %
Neutrophil #: 4.7 x10 3/mm (ref 1.4–6.5)
PLATELETS: 338 x10 3/mm (ref 150–440)
RBC: 4.41 10*6/uL (ref 3.80–5.20)
RDW: 13.4 % (ref 11.5–14.5)
WBC: 7.8 x10 3/mm (ref 3.6–11.0)

## 2015-01-09 LAB — CA 125: CA 125: 6.7 U/mL (ref 0.0–34.0)

## 2015-01-21 ENCOUNTER — Ambulatory Visit: Payer: Self-pay | Admitting: Oncology

## 2015-02-22 ENCOUNTER — Ambulatory Visit
Admit: 2015-02-22 | Disposition: A | Discharge status: Home / Self Care | Payer: Self-pay | Attending: Family Medicine | Admitting: Family Medicine

## 2015-02-22 ENCOUNTER — Ambulatory Visit: Payer: Self-pay | Admitting: Family Medicine

## 2015-03-06 ENCOUNTER — Ambulatory Visit: Payer: Self-pay | Admitting: Oncology

## 2015-03-22 ENCOUNTER — Ambulatory Visit
Admit: 2015-03-22 | Disposition: A | Discharge status: Home / Self Care | Payer: Self-pay | Attending: Family Medicine | Admitting: Family Medicine

## 2015-04-14 NOTE — Op Note (Signed)
PATIENT NAMEJERILYN, [name] MR#:  [id number] DATE OF BIRTH:  07-04-1946  DATE OF PROCEDURE:  04/26/2012  PREOPERATIVE DIAGNOSIS: Right ureteral stent.   POSTOPERATIVE DIAGNOSIS: Right ureteral stent. No evidence of further bladder injury.   PROCEDURE DONE: Removal of right ureteral stent.    SURGEON: Weber Cooks, MD  ANESTHESIA: General.   ESTIMATED BLOOD LOSS: None.   COMPLICATIONS: None.   INDICATION FOR SURGERY: Ms. Loiselle is a 69 year old patient who had undergone staging procedures including lymphadenectomy for ovarian cancer and doing nodal dissection on the right pelvic sidewall an injury of the ureter developed which was repaired over a stent. There were no postoperative complications and therefore it was now time to remove the stent.   OPERATIVE REPORT: After adequate general anesthesia had been obtained, the patient was prepped and draped in ski position. The cystoscope was inserted into the vagina. Bladder, ureters and stent were visualized. Then the stent was grasped and removed.   The patient tolerated the procedure well and was taken to recovery room in satisfactory condition. Postoperative urine was clear. Pad, sponge, needle and instrument counts were correct x2.  ____________________________ Weber Cooks, MD bem:cms D: 04/27/2012 14:44:25 ET T: 04/27/2012 16:12:40 ET  JOB#: [id number] cc: Weber Cooks, MD, <Dictator> Weber Cooks MD ELECTRONICALLY SIGNED 05/03/2012 14:13

## 2015-04-14 NOTE — Op Note (Signed)
PATIENT NAMEASHANTA, [name] MR#:  [id number] DATE OF BIRTH:  1946-05-19  DATE OF PROCEDURE:  03/22/2012  TIME OF SURGERY: 7:30 a.m.   PREOPERATIVE DIAGNOSIS: Left ovarian cyst.  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 The pathology read was concerning for possible invasive disease.   OPERATION PERFORMED:  1. Total abdominal hysterectomy and bilateral salpingo-oophorectomy by Dr. Malachy Mood. 2. Subsequent omentectomy, pelvic and periaortic lymph node dissection, peritoneal biopsies, and stenting of ureteral transection by Dr. Jacquelyne Balint. Please see separate operative note.   PRIMARY SURGEON: Stoney Bang. Georgianne Fick, MD    ASSISTANT: Erik Obey, MD   INTRAOPERATIVE CONSULTATION: Jacquelyne Balint, MD   ESTIMATED BLOOD LOSS: 150 mL.  OPERATIVE FLUIDS: 2700 mL of Crystalloid.   URINE OUTPUT: 300 mL.   DRAINS OR TUBES: Foley to gravity drainage. Right JP drain.   IMPLANTS OR DEVICES: Right 6 French double pigtail urethral stent.   SPECIMENS REMOVED: Uterus, cervix, bilateral fallopian tubes and ovaries, as well as omentum, peritoneal biopsies, pelvic and periaortic lymph nodes.   INTRAOPERATIVE FINDINGS: Upon entering the abdomen, a large mass was noted arising from the left adnexa. The mass was smooth walled on the exterior without evidence of excrescences. The remainder of the abdomen and pelvis were free of any metastatic implants. The mass was attempted to be delivered onto the abdomen to allow visualization of the infundibulopelvic ligament, however, during attempt to deliver the mass the cyst was ruptured. Pelvic washings were obtained prior to rupturing the cyst. Cyst fluid was noted to be dark brown consistent with old blood. The uterus and right ovary were grossly normal. The omentum had some firm nodularity to it but appeared grossly normal.   COMPLICATIONS: Right ureteral injury  transection.    ANTIBIOTICS: 2 grams of Ancef with the second dose given three hours into the case.   POSTOPERATIVE CONDITION: Stable.   PROCEDURE IN DETAIL: Risks, benefits, and alternatives of the procedure were discussed with the patient prior to proceeding to the operating room. The patient had been fully consented for possible staging and was aware that there was a possibility of requiring intraoperative consultation by Dr. Sabra Heck.   The patient was taken back to the operating room and placed under general endotracheal anesthesia. She was positioned in the supine position and prepped and draped in the usual sterile fashion. A midline vertical skin incision was made with the scalpel and carried down to the level of the rectus fascia using the Bovie. The rectus fascia was then incised with the Bovie. The fascia was tented up using a hemostat and the fascial incision was extended cephalad and caudad. Upon making the fascial extension, the fascia was tented up on the right side using Kocher clamps. The underlying rectus muscle was dissected off and the midline was identified. The peritoneum was grasped with a hemostat, tented up and incised using Metzenbaum scissors. The peritoneal incision was then extended using the Metzenbaum scissors. Upon entry into the abdomen, the above-noted findings were seen. Pelvic washings were obtained. Examination of the mass revealed the mass to be arising from the left ovary. Attempt was made to deliver the mass onto the abdomen. Following this, however, the mass did rupture. At this time a Colon Flattery was placed and the bowels were packed using moist lap sponges.   Attention was turned to the left round ligament. A #1 Vicryl suture was passed under the round ligament which was then  suture ligated and tagged. The round ligament was then incised using the Bovie. The anterior leaf of the broad ligament was incised using the Bovie and carried down to the level of the internal  cervical os creating a bladder flap. The posterior leaf of the broad was then dissected, peritoneal window was created, and a Heaney clamp was clamped across the infundibulopelvic ligament. Of note, both uterine cornua had been grasped with two Kelly clamps to allow elevation of the specimen. Following the placement of the Heaney clamp across the infundibulopelvic ligament, the specimen was excised and sent to frozen pathology using Mayo scissors. The infundibulopelvic ligament infant was then suture ligated using a fore and aft stitch following #1 Vicryl. Following the fore and aft stitch, a second free tie of #1 Vicryl was placed.   Next, attention was turned to the patient's right side and the round ligament was also suture ligated and tagged using a #1Vicryl. The round ligament was incised with the Bovie. The anterior leaf of the broad ligament was dissected down to the level of the internal cervical os meeting the previous incision. Bladder flap was thus created and the bladder was dissected off the specimen bluntly using a sponge stick.   Attention was then turned to the posterior leaf of the broad ligament which was incised. The peritoneal window was created and allowing placement of a Heaney clamp across the right infundibulopelvic ligament, the right infundibulopelvic pelvic ligament was then incised using Mayo scissors. The infundibulopelvic ligament on the right was also doubly ligated first with a fore and aft stitch of #1 Vicryl and then a free tie of #1 Vicryl. The posterior portion of the broad ligament was then incised with the Bovie down to just short of the uterosacral ligaments. The uterine vessels were skeletonized bilaterally and then clamped with Heaney clamps. The uterine vessels were then transected using Mayo scissors and suture ligated using a single #1 Vicryl. Following transection of the uterine vessels, serial bites were taken medial to the prior pedicles using a straight Heaney  clamp until the uterosacral ligaments had been reached. These bites were suture ligated using a #1 Vicryl and a Heaney stitch. Once the level of the uterosacral ligaments had been reached, a curved Heaney clamp was placed across the uterosacral ligaments in the upper portion of the vagina. The specimen was then excised using Jorgenson scissors. The vaginal cuff was closed using figure-of-eight stitches of #1 Vicryl.   At this time, frozen pathology returned. The initial frozen pathology section was inconclusive and was consistent with at least a borderline serous tumor showing high mitotic activity. Two subsequent frozen sections did not show any invasion although pathology favored possible invasion.   At this time, Dr. Sabra Heck was informed of the pathology and intraoperative consultation was provided. Please see Dr. Ammie Ferrier operative note for the omentectomy, pelvic and periaortic lymph node dissection, as well as ureteral transection repair.   Following closure of the vaginal cuff, the pelvis was copiously irrigated and the vaginal cuff was inspected and noted to be hemostatic. Following Dr. Ammie Ferrier portion of the case, the fascial incision was closed using a loop 0 PDS in a running fashion. Following closure of the fascia, the subcutaneous tissue was reapproximated using a large chromic suture. The skin was closed using staples. Sponge, needle, and instrument counts were correct x2. The patient tolerated the procedure well and was taken to the recovery room in stable condition.   ____________________________ Stoney Bang. Georgianne Fick, MD ams:drc D: 03/22/2012  18:27:32 ET T: 03/23/2012 09:55:07 ET JOB#: [id number]  cc: Stoney Bang. Georgianne Fick, MD, <Dictator> Conan Bowens Madelon Lips MD ELECTRONICALLY SIGNED 03/31/2012 13:00

## 2015-04-14 NOTE — Op Note (Signed)
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 No microscopic evidence of extraovarian disease.   PROCEDURES PERFORMED:  1. Staging biopsies.  2. Pelvic and periaortic lymph node sampling.  3. Infracolic omentectomy.  4. Repair of ureteral injury.   SURGEON: Weber Cooks, MD   ASSISTANT: Malachy Mood, MD   COMPLICATIONS: Injury to the right ureter.   INDICATION FOR SURGERY: [name] is a 69 year old patient who presented with a 10 cm pelvic mass. There were no papillations or excrescences. TAH, BSO was done without complication. On frozen section analysis, the ovarian mass was noted to be of low malignant potential, however, there was high suspicion for invasion, therefore, decision was made to proceed with staging procedures.   FINDINGS AT THE TIME OF SURGERY: The uterus and adnexa were already removed. Careful inspection of pelvis and upper abdomen failed to reveal any evidence of peritoneal disease. There was no adenopathy. The diaphragm was unremarkable.   OPERATIVE REPORT: The hysterectomy had been done by Dr. Georgianne Fick and is dictated by him.   Careful inspection was done with the above-mentioned findings.   Peritoneal biopsies were taken from large and small bowel of the pelvis and the abdominal wall and lateral gutters.   Attention was then directed towards the omentectomy. The omentum was detached from the colon and using the Harmonic scalpel an infracolic omentectomy was done without complications. Hemostasis was noted to be adequate.   Attention was directed towards the left pelvic sidewall. Vessels and ureter were identified. Using the Harmonic scalpel,  the node bearing fatty tissue around external iliac vessels, lower common iliac artery, and hypogastric vessels were removed. Hemostasis was noted to be adequate. Vessels, ureter, and obturator nerve were kept under constant visualization. The same procedure was then performed on the right side. However, scars were noted around the common iliac artery and during dissection injury of the ureter occurred. The nodal sampling was completed in a similar fashion using the Harmonic scalpel to remove the node bearing tissue around the lower common iliac artery, external iliac vessels, and from the hypogastric vessels. Again, hemostasis was noted to be adequate.   Attention was directed towards the periaortic sampling. Initially the lower periaortic area was explored from the pelvis visualizing the upper common iliac arteries on the right and left side using the right-sided approach. Peritoneal extension was extended on the right side to the periaortic area. Again, palpation failed to reveal any definite adenopathy. Using the Harmonic scalpel, sample of the node bearing tissue around and above the IMA was taken. Hemostasis was noted to be adequate at the end of the procedure.   Attention was then turned to repair of the ureteral injury. A double pigtail stent 28 cm was pushed in the renal pelvis and in the bladder through the ureter. The ureteral injury had been freshed using Metzenbaum scissors. The ends were then reapproximated with interrupted 4-0 PDS stitches. The connective tissue around the ureter was then again fixated. There was no evidence of leakage after injections of methylene blue which was watched all the way through excretion to the area from the Foley catheter. A JP drain was placed.   Irrigation of the pelvis was performed and adequate hemostasis was confirmed. FloSeal  was placed in the areas of nodal dissection. The bowel was allowed to fall back into the pelvic cavity.   The remainder of the  procedure is dictated by Dr. Georgianne Fick.  ____________________________ Weber Cooks, MD bem:drc D: 03/22/2012 13:22:31 ET T: 03/22/2012 14:40:23 ET JOB#: [id number] Weber Cooks MD ELECTRONICALLY SIGNED 03/29/2012 14:53

## 2015-05-01 ENCOUNTER — Other Ambulatory Visit: Payer: Medicare Other

## 2015-05-01 ENCOUNTER — Ambulatory Visit: Payer: Medicare Other

## 2015-05-29 ENCOUNTER — Other Ambulatory Visit: Payer: Self-pay | Admitting: *Deleted

## 2015-05-29 ENCOUNTER — Encounter (INDEPENDENT_AMBULATORY_CARE_PROVIDER_SITE_OTHER): Payer: Self-pay

## 2015-05-29 ENCOUNTER — Inpatient Hospital Stay: Payer: Medicare Other | Attending: Obstetrics and Gynecology

## 2015-05-29 ENCOUNTER — Inpatient Hospital Stay (HOSPITAL_BASED_OUTPATIENT_CLINIC_OR_DEPARTMENT_OTHER): Payer: Medicare Other | Admitting: Obstetrics and Gynecology

## 2015-05-29 VITALS — BP 122/72 | HR 73 | Temp 98.3°F | Resp 20 | Ht 68.0 in | Wt 181.4 lb

## 2015-05-29 DIAGNOSIS — F17210 Nicotine dependence, cigarettes, uncomplicated: Secondary | ICD-10-CM | POA: Diagnosis not present

## 2015-05-29 DIAGNOSIS — Z79899 Other long term (current) drug therapy: Secondary | ICD-10-CM | POA: Diagnosis not present

## 2015-05-29 DIAGNOSIS — C569 Malignant neoplasm of unspecified ovary: Secondary | ICD-10-CM

## 2015-05-29 DIAGNOSIS — C562 Malignant neoplasm of left ovary: Secondary | ICD-10-CM

## 2015-05-29 DIAGNOSIS — Z8543 Personal history of malignant neoplasm of ovary: Secondary | ICD-10-CM | POA: Diagnosis present

## 2015-05-29 DIAGNOSIS — M1990 Unspecified osteoarthritis, unspecified site: Secondary | ICD-10-CM

## 2015-05-29 NOTE — Progress Notes (Signed)
Gynecologic Oncology Interval Note  Referring Provider: Dr. Georgianne Fick and Dr. Oliva Bustard  Chief Concern: Ovarian cancer surveillance.   Subjective:  [name] is a 69 y.o. woman who presents today for continued surveillance for history of stage IB clear cell/serous ovarian cancer.   Oncology Treatment History:   03/2012   presented with 10 cm pelvic mass and normal CA125 (16.2).  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  TAH/BSO and complete staging with four negative left pelvic nodes, 6 negative right pelvic nodes, 1 negative aortic node, negative omentum (Dr Sabra Heck and Dr Star Age). Carboplatin and paclitaxel x 4 with Dr Oliva Bustard, completed NED 07/2012  03/2014   back pain, but CT scan abd/pelvis negative 06/20/14    CA125 = 6.3 08/14/14  CT scan abd/pelvis negative  Problem List: Patient Active Problem List   Diagnosis Date Noted  . Ovarian cancer 05/29/2015    Past Medical History: Past Medical History  Diagnosis Date  . Ovarian cancer   . Arthritis   . Goiter, nodular   . Goiter     Past Surgical History: Past Surgical History  Procedure Laterality Date  . Ankle fracture surgery    . Lumbar fusion    . Rotator cuff repair      Family History: Family History  Problem Relation Age of Onset  . Alzheimer's disease Mother   . Tuberculosis Father     Social History: History   Social History  . Marital Status: Widowed    Spouse Name: N/A  . Number of Children: N/A  . Years of Education: N/A   Occupational History  . Not on file.   Social History Main Topics  . Smoking status: Current Every Day Smoker -- 0.50 packs/day for 50 years    Types: Cigarettes  . Smokeless tobacco: Never Used  . Alcohol Use: No  . Drug Use: No  . Sexual Activity: Not on file   Other Topics Concern  . Not on file   Social History Narrative  . No narrative on file    Allergies: Allergies  Allergen Reactions  . Hydrocodone  Shortness Of Breath  . Sulfa Antibiotics     Current Medications: Current Outpatient Prescriptions  Medication Sig Dispense Refill  . bimatoprost (LUMIGAN) 0.01 % SOLN     . calcium carbonate (OS-CAL) 600 MG TABS tablet Take 600 mg by mouth.    . ergocalciferol (VITAMIN D2) 50000 UNITS capsule Take 1,000 Units by mouth.    . esomeprazole (NEXIUM) 40 MG capsule TAKE 1 CAPSULE (40 MG TOTAL) BY MOUTH 30 (THIRTY) MINUTES BEFORE BREAKFAST.    . fenofibrate (TRICOR) 145 MG tablet Take 1 tablet by mouth daily.    Marland Kitchen LORazepam (ATIVAN) 0.5 MG tablet Take 0.5 mg by mouth as needed.    . naproxen (NAPROSYN) 375 MG tablet Take 375 mg by mouth.    . Umeclidinium Bromide (INCRUSE ELLIPTA) 62.5 MCG/INH AEPB Inhale 1 puff into the lungs.     No current facility-administered medications for this visit.     Review of Systems A comprehensive review of systems was negative.  Objective:  BP 122/72 mmHg  Pulse 73  Temp(Src) 98.3 F (36.8 C) (Oral)  Resp 20  Ht 5' 8"  (1.727 m)  Wt 181 lb 7 oz (82.3 kg)  BMI 27.59 kg/m2   ECOG Performance Status: 0 - Asymptomatic    General appearance: alert, cooperative and appears stated age HEENT:PERRLA, sclera clear, anicteric, neck supple with  midline trachea and thyroid without masses Lymph node survey: negative Cardiovascular: normal rate and rhythm, no murmurs Respiratory: clear to A and P Breast exam: not done. Abdomen: soft, no masses or hernias. Non tender. Back: inspection of back is normal Extremities: no edema or lesions  Skin exam: normal coloration and turgor, no rashes, no suspicious skin lesions noted. Neurological exam reveals: alert, oriented, normal speech, no focal findings or movement disorder noted.  Pelvic: exam chaperoned by nurse;  Vulva: normal appearing vulva with no masses, tenderness or lesions; Vagina: normal; Cervix: normal; Adnexa: nontender, ; Uterus: uterus is normal size, shape, consistency and nontender;  Rectal:  confirms.  Lab Review CA-125 pending. Lab Results  Component Value Date   CA125 6.7 01/08/2015   CA125 8.0 10/10/2014   CA125 6.3 06/20/2014    Assessment:  [name] is a 69 y.o. 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  Plan:   Problem List Items Addressed This Visit    Ovarian cancer - Primary   Relevant Medications   LORazepam (ATIVAN) 0.5 MG tablet   naproxen (NAPROSYN) 375 MG tablet     We will get a CA 125 level, although this was not elevated pre-operatively. She will then RTC in 6 months to see [place] and to see Dr Oliva Bustard in 3 months with CA125.     She will call in the interval with any concerning symptoms.  CT scan can be performed if any rise in CA125 or concerning signs or symptoms. She is comfortable with the plan and had her questions answered.    My nurse will contact the patient about genetic testing for BRCA1/2.  Mellody Drown, MD  CC:  Pollyann Samples, MD [address] [place], [place] [address] [phone]

## 2015-05-30 LAB — CA 125: CA 125: 6.7 U/mL (ref 0.0–38.1)

## 2015-07-05 ENCOUNTER — Other Ambulatory Visit: Payer: Self-pay | Admitting: *Deleted

## 2015-07-05 DIAGNOSIS — C569 Malignant neoplasm of unspecified ovary: Secondary | ICD-10-CM

## 2015-07-09 ENCOUNTER — Inpatient Hospital Stay: Payer: Medicare Other | Attending: Oncology

## 2015-07-09 ENCOUNTER — Inpatient Hospital Stay (HOSPITAL_BASED_OUTPATIENT_CLINIC_OR_DEPARTMENT_OTHER): Payer: Medicare Other | Admitting: Oncology

## 2015-07-09 VITALS — BP 126/71 | HR 67 | Temp 96.5°F | Wt 181.4 lb

## 2015-07-09 DIAGNOSIS — C569 Malignant neoplasm of unspecified ovary: Secondary | ICD-10-CM

## 2015-07-09 DIAGNOSIS — Z79899 Other long term (current) drug therapy: Secondary | ICD-10-CM

## 2015-07-09 DIAGNOSIS — R911 Solitary pulmonary nodule: Secondary | ICD-10-CM | POA: Insufficient documentation

## 2015-07-09 DIAGNOSIS — Z8543 Personal history of malignant neoplasm of ovary: Secondary | ICD-10-CM

## 2015-07-09 DIAGNOSIS — R5383 Other fatigue: Secondary | ICD-10-CM | POA: Insufficient documentation

## 2015-07-09 DIAGNOSIS — F17210 Nicotine dependence, cigarettes, uncomplicated: Secondary | ICD-10-CM | POA: Insufficient documentation

## 2015-07-09 DIAGNOSIS — R531 Weakness: Secondary | ICD-10-CM | POA: Diagnosis not present

## 2015-07-09 DIAGNOSIS — M1990 Unspecified osteoarthritis, unspecified site: Secondary | ICD-10-CM

## 2015-07-09 DIAGNOSIS — R918 Other nonspecific abnormal finding of lung field: Secondary | ICD-10-CM

## 2015-07-09 DIAGNOSIS — F329 Major depressive disorder, single episode, unspecified: Secondary | ICD-10-CM | POA: Insufficient documentation

## 2015-07-09 LAB — CBC WITH DIFFERENTIAL/PLATELET
Basophils Absolute: 0 10*3/uL (ref 0–0.1)
Basophils Relative: 0 %
EOS PCT: 2 %
Eosinophils Absolute: 0.1 10*3/uL (ref 0–0.7)
HCT: 38.4 % (ref 35.0–47.0)
Hemoglobin: 12.8 g/dL (ref 12.0–16.0)
LYMPHS ABS: 2.1 10*3/uL (ref 1.0–3.6)
Lymphocytes Relative: 30 %
MCH: 30.3 pg (ref 26.0–34.0)
MCHC: 33.3 g/dL (ref 32.0–36.0)
MCV: 91.1 fL (ref 80.0–100.0)
Monocytes Absolute: 0.6 10*3/uL (ref 0.2–0.9)
Monocytes Relative: 8 %
Neutro Abs: 4.2 10*3/uL (ref 1.4–6.5)
Neutrophils Relative %: 60 %
Platelets: 330 10*3/uL (ref 150–440)
RBC: 4.21 MIL/uL (ref 3.80–5.20)
RDW: 13.4 % (ref 11.5–14.5)
WBC: 7.1 10*3/uL (ref 3.6–11.0)

## 2015-07-09 LAB — COMPREHENSIVE METABOLIC PANEL
ALT: 8 U/L — ABNORMAL LOW (ref 14–54)
AST: 24 U/L (ref 15–41)
Albumin: 4.6 g/dL (ref 3.5–5.0)
Alkaline Phosphatase: 67 U/L (ref 38–126)
Anion gap: 8 (ref 5–15)
BUN: 9 mg/dL (ref 6–20)
CALCIUM: 9.1 mg/dL (ref 8.9–10.3)
CHLORIDE: 102 mmol/L (ref 101–111)
CO2: 27 mmol/L (ref 22–32)
Creatinine, Ser: 0.84 mg/dL (ref 0.44–1.00)
Glucose, Bld: 84 mg/dL (ref 65–99)
Potassium: 4.3 mmol/L (ref 3.5–5.1)
SODIUM: 137 mmol/L (ref 135–145)
Total Bilirubin: 0.6 mg/dL (ref 0.3–1.2)
Total Protein: 7.2 g/dL (ref 6.5–8.1)

## 2015-07-09 NOTE — Progress Notes (Signed)
Current smoker..  Patient does not have living will.  Complains of gas in abdominal area and constipation.

## 2015-07-10 LAB — CA 125: CA 125: 7.2 U/mL (ref 0.0–38.1)

## 2015-07-13 ENCOUNTER — Encounter: Payer: Self-pay | Admitting: Oncology

## 2015-07-13 DIAGNOSIS — R5383 Other fatigue: Secondary | ICD-10-CM | POA: Insufficient documentation

## 2015-07-13 DIAGNOSIS — C569 Malignant neoplasm of unspecified ovary: Secondary | ICD-10-CM | POA: Insufficient documentation

## 2015-07-13 DIAGNOSIS — E559 Vitamin D deficiency, unspecified: Secondary | ICD-10-CM | POA: Insufficient documentation

## 2015-07-13 DIAGNOSIS — H409 Unspecified glaucoma: Secondary | ICD-10-CM | POA: Insufficient documentation

## 2015-07-13 DIAGNOSIS — J449 Chronic obstructive pulmonary disease, unspecified: Secondary | ICD-10-CM | POA: Insufficient documentation

## 2015-07-13 DIAGNOSIS — J45909 Unspecified asthma, uncomplicated: Secondary | ICD-10-CM | POA: Insufficient documentation

## 2015-07-13 DIAGNOSIS — F32A Depression, unspecified: Secondary | ICD-10-CM | POA: Insufficient documentation

## 2015-07-13 DIAGNOSIS — F329 Major depressive disorder, single episode, unspecified: Secondary | ICD-10-CM | POA: Insufficient documentation

## 2015-07-13 HISTORY — DX: Malignant neoplasm of unspecified ovary: C56.9

## 2015-07-13 NOTE — Progress Notes (Signed)
[place] @ [facility] Telephone:(336) [phone]  Fax:(336) [name]: 06-27-1946  MR#: [id number]  HKV#:[id number]  Patient Care Team: Pollyann Samples, MD as PCP - General (Family Medicine)  CHIEF COMPLAINT:  Chief Complaint  Patient presents with  . Follow-up    69 year old lady with a history of ovarian cancer came today for the follow-up  No flowsheet data found.  INTERVAL HISTORY: [age]-year-old lady with a history of clear cell carcinoma focally.  No abdominal pain no nausea no vomiting patient continues to be very depressed continues to smoke had a routine screening CT scan was revealing a pulmonary nodule which needs follow-up REVIEW OF SYSTEMS:    general status: Patient is feeling weak and tired.  No change in a performance status.  No chills.  No fever. HEENT: .  No evidence of stomatitis Lungs: No cough or shortness of breath Cardiac: No chest pain or paroxysmal nocturnal dyspnea GI: No nausea no vomiting no diarrhea no abdominal pain Skin: No rash Lower extremity no swelling Neurological system: No tingling.  No numbness.  No other focal signs Musculoskeletal system no bony pains  As per HPI. Otherwise, a complete review of systems is negatve.  PAST MEDICAL HISTORY: Past Medical History  Diagnosis Date  . Ovarian cancer   . Arthritis   . Goiter, nodular   . Goiter     PAST SURGICAL HISTORY: Past Surgical History  Procedure Laterality Date  . Ankle fracture surgery    . Lumbar fusion    . Rotator cuff repair      FAMILY HISTORY Family History  Problem Relation Age of Onset  . Alzheimer's disease Mother   . Tuberculosis Father     ADVANCED DIRECTIVES:  Patient does not have any living will or healthcare power of attorney.  Information was given .  Available resources had been discussed.  We will follow-up on subsequent appointments regarding this issue  HEALTH MAINTENANCE: History  Substance Use Topics  . Smoking status:  Current Every Day Smoker -- 0.50 packs/day for 50 years    Types: Cigarettes  . Smokeless tobacco: Never Used  . Alcohol Use: No      Allergies  Allergen Reactions  . Hydrocodone Shortness Of Breath  . Sulfa Antibiotics     Current Outpatient Prescriptions  Medication Sig Dispense Refill  . bimatoprost (LUMIGAN) 0.01 % SOLN     . calcium carbonate (OS-CAL) 600 MG TABS tablet Take 600 mg by mouth.    . ergocalciferol (VITAMIN D2) 50000 UNITS capsule Take 1,000 Units by mouth.    . esomeprazole (NEXIUM) 40 MG capsule TAKE 1 CAPSULE (40 MG TOTAL) BY MOUTH 30 (THIRTY) MINUTES BEFORE BREAKFAST.    . fenofibrate (TRICOR) 145 MG tablet Take 1 tablet by mouth daily.    Marland Kitchen LORazepam (ATIVAN) 0.5 MG tablet Take 0.5 mg by mouth as needed.    Marland Kitchen Umeclidinium Bromide (INCRUSE ELLIPTA) 62.5 MCG/INH AEPB Inhale 1 puff into the lungs.    . COMBIGAN 0.2-0.5 % ophthalmic solution PLACE 1 DROP IN LEFT EYE TWICE A DAY  2  . naproxen (NAPROSYN) 375 MG tablet Take 375 mg by mouth.     No current facility-administered medications for this visit.    OBJECTIVE:  Filed Vitals:   07/09/15 1531  BP: 126/71  Pulse: 67  Temp: 96.5 F (35.8 C)     Body mass index is 27.59 kg/(m^2).    ECOG FS:1 - Symptomatic but completely ambulatory  PHYSICAL EXAM: General  status: Performance status is good.  Patient has not lost significant weight HEENT: No evidence of stomatitis. Sclera and conjunctivae :: No jaundice.   pale looking. Lungs: Air  entry equal on both sides.  No rhonchi.  No rales.  Cardiac: Heart sounds are normal.  No pericardial rub.  No murmur. Lymphatic system: Cervical, axillary, inguinal, lymph nodes not palpable GI: Abdomen is soft.  No ascites.  Liver spleen not palpable.  No tenderness.  Bowel sounds are within normal limit Lower extremity: No edema Neurological system: Higher functions, cranial nerves intact no evidence of peripheral neuropathy. Skin: No rash.  No ecchymosis.Marland Kitchen   LAB  RESULTS:  Appointment on 07/09/2015  Component Date Value Ref Range Status  . WBC 07/09/2015 7.1  3.6 - 11.0 K/uL Final  . RBC 07/09/2015 4.21  3.80 - 5.20 MIL/uL Final  . Hemoglobin 07/09/2015 12.8  12.0 - 16.0 g/dL Final  . HCT 07/09/2015 38.4  35.0 - 47.0 % Final  . MCV 07/09/2015 91.1  80.0 - 100.0 fL Final  . MCH 07/09/2015 30.3  26.0 - 34.0 pg Final  . MCHC 07/09/2015 33.3  32.0 - 36.0 g/dL Final  . RDW 07/09/2015 13.4  11.5 - 14.5 % Final  . Platelets 07/09/2015 330  150 - 440 K/uL Final  . Neutrophils Relative % 07/09/2015 60   Final  . Neutro Abs 07/09/2015 4.2  1.4 - 6.5 K/uL Final  . Lymphocytes Relative 07/09/2015 30   Final  . Lymphs Abs 07/09/2015 2.1  1.0 - 3.6 K/uL Final  . Monocytes Relative 07/09/2015 8   Final  . Monocytes Absolute 07/09/2015 0.6  0.2 - 0.9 K/uL Final  . Eosinophils Relative 07/09/2015 2   Final  . Eosinophils Absolute 07/09/2015 0.1  0 - 0.7 K/uL Final  . Basophils Relative 07/09/2015 0   Final  . Basophils Absolute 07/09/2015 0.0  0 - 0.1 K/uL Final  . Sodium 07/09/2015 137  135 - 145 mmol/L Final  . Potassium 07/09/2015 4.3  3.5 - 5.1 mmol/L Final  . Chloride 07/09/2015 102  101 - 111 mmol/L Final  . CO2 07/09/2015 27  22 - 32 mmol/L Final  . Glucose, Bld 07/09/2015 84  65 - 99 mg/dL Final  . BUN 07/09/2015 9  6 - 20 mg/dL Final  . Creatinine, Ser 07/09/2015 0.84  0.44 - 1.00 mg/dL Final  . Calcium 07/09/2015 9.1  8.9 - 10.3 mg/dL Final  . Total Protein 07/09/2015 7.2  6.5 - 8.1 g/dL Final  . Albumin 07/09/2015 4.6  3.5 - 5.0 g/dL Final  . AST 07/09/2015 24  15 - 41 U/L Final  . ALT 07/09/2015 8* 14 - 54 U/L Final  . Alkaline Phosphatase 07/09/2015 67  38 - 126 U/L Final  . Total Bilirubin 07/09/2015 0.6  0.3 - 1.2 mg/dL Final  . GFR calc non Af Amer 07/09/2015 >60  >60 mL/min Final  . GFR calc Af Amer 07/09/2015 >60  >60 mL/min Final   Comment: (NOTE) The eGFR has been calculated using the CKD EPI equation. This calculation has not been  validated in all clinical situations. eGFR's persistently <60 mL/min signify possible Chronic Kidney Disease.   . Anion gap 07/09/2015 8  5 - 15 Final  . CA 125 07/09/2015 7.2  0.0 - 38.1 U/mL Final   Comment: (NOTE) Roche ECLIA methodology Performed At: [facility] [place], [place] [id number] Lindon Romp MD ZH:[id number]  STUDIES: No results found.  ASSESSMENT: Clear cell carcinoma from ovary on clinical examination there is no evidence of recurrent disease Chronic smoker has pulmonary nodule repeat CT scan has been ordered Nurse navigator  was informed to follow up on lung nodule  MEDICAL DECISION MAKING:  Lab data has been reviewed. Tumor markers are within acceptable range Total duration of visit was 45 minutes.  50% or more time was spent in counseling patient and family regarding prognosis and options of treatment and available resources guarding smoking as well as need for screening CT scan  Patient expressed understanding and was in agreement with this plan. She also understands that She can call clinic at any time with any questions, concerns, or complaints.    No matching staging information was found for the patient.  Forest Gleason, MD   07/13/2015 11:19 AM

## 2015-09-10 ENCOUNTER — Telehealth: Payer: Self-pay | Admitting: *Deleted

## 2015-09-10 NOTE — Telephone Encounter (Signed)
Left voicemail for patient notifyng them that it is time to schedule lung cancer screening CT scan. Instructed patient to call back to verify information prior to the scan being scheduled.

## 2015-09-12 ENCOUNTER — Ambulatory Visit
Admission: RE | Admit: 2015-09-12 | Discharge: 2015-09-12 | Disposition: A | Discharge status: Home / Self Care | Payer: Medicare Other | Source: Ambulatory Visit | Attending: Oncology | Admitting: Oncology

## 2015-09-12 DIAGNOSIS — I2510 Atherosclerotic heart disease of native coronary artery without angina pectoris: Secondary | ICD-10-CM | POA: Insufficient documentation

## 2015-09-12 DIAGNOSIS — R911 Solitary pulmonary nodule: Secondary | ICD-10-CM

## 2015-09-12 DIAGNOSIS — R918 Other nonspecific abnormal finding of lung field: Secondary | ICD-10-CM | POA: Insufficient documentation

## 2015-09-12 DIAGNOSIS — J439 Emphysema, unspecified: Secondary | ICD-10-CM | POA: Diagnosis not present

## 2015-09-12 DIAGNOSIS — E042 Nontoxic multinodular goiter: Secondary | ICD-10-CM | POA: Insufficient documentation

## 2015-11-07 ENCOUNTER — Inpatient Hospital Stay: Payer: Medicare Other | Attending: Oncology

## 2015-11-07 ENCOUNTER — Inpatient Hospital Stay (HOSPITAL_BASED_OUTPATIENT_CLINIC_OR_DEPARTMENT_OTHER): Payer: Medicare Other | Admitting: Oncology

## 2015-11-07 ENCOUNTER — Encounter: Payer: Self-pay | Admitting: Oncology

## 2015-11-07 VITALS — BP 144/90 | HR 82 | Temp 96.5°F | Wt 181.7 lb

## 2015-11-07 DIAGNOSIS — R5383 Other fatigue: Secondary | ICD-10-CM | POA: Diagnosis not present

## 2015-11-07 DIAGNOSIS — F329 Major depressive disorder, single episode, unspecified: Secondary | ICD-10-CM

## 2015-11-07 DIAGNOSIS — R918 Other nonspecific abnormal finding of lung field: Secondary | ICD-10-CM | POA: Insufficient documentation

## 2015-11-07 DIAGNOSIS — Z79899 Other long term (current) drug therapy: Secondary | ICD-10-CM | POA: Insufficient documentation

## 2015-11-07 DIAGNOSIS — Z8543 Personal history of malignant neoplasm of ovary: Secondary | ICD-10-CM | POA: Diagnosis not present

## 2015-11-07 DIAGNOSIS — M1990 Unspecified osteoarthritis, unspecified site: Secondary | ICD-10-CM

## 2015-11-07 DIAGNOSIS — E042 Nontoxic multinodular goiter: Secondary | ICD-10-CM

## 2015-11-07 DIAGNOSIS — C569 Malignant neoplasm of unspecified ovary: Secondary | ICD-10-CM

## 2015-11-07 DIAGNOSIS — I2510 Atherosclerotic heart disease of native coronary artery without angina pectoris: Secondary | ICD-10-CM | POA: Diagnosis not present

## 2015-11-07 DIAGNOSIS — F17210 Nicotine dependence, cigarettes, uncomplicated: Secondary | ICD-10-CM

## 2015-11-07 DIAGNOSIS — J432 Centrilobular emphysema: Secondary | ICD-10-CM | POA: Diagnosis not present

## 2015-11-07 DIAGNOSIS — R531 Weakness: Secondary | ICD-10-CM | POA: Diagnosis not present

## 2015-11-07 LAB — COMPREHENSIVE METABOLIC PANEL
ALT: 8 U/L — AB (ref 14–54)
AST: 23 U/L (ref 15–41)
Albumin: 4.4 g/dL (ref 3.5–5.0)
Alkaline Phosphatase: 61 U/L (ref 38–126)
Anion gap: 7 (ref 5–15)
BILIRUBIN TOTAL: 0.5 mg/dL (ref 0.3–1.2)
BUN: 12 mg/dL (ref 6–20)
CO2: 28 mmol/L (ref 22–32)
Calcium: 9.4 mg/dL (ref 8.9–10.3)
Chloride: 101 mmol/L (ref 101–111)
Creatinine, Ser: 0.7 mg/dL (ref 0.44–1.00)
GFR calc Af Amer: >60 mL/min (ref >60)
GFR calc non Af Amer: >60 mL/min (ref >60)
GLUCOSE: 94 mg/dL (ref 65–99)
Potassium: 3.7 mmol/L (ref 3.5–5.1)
Sodium: 136 mmol/L (ref 135–145)
TOTAL PROTEIN: 7.1 g/dL (ref 6.5–8.1)

## 2015-11-07 LAB — CBC WITH DIFFERENTIAL/PLATELET
Basophils Absolute: 0 10*3/uL (ref 0–0.1)
Basophils Relative: 1 %
Eosinophils Absolute: 0.1 10*3/uL (ref 0–0.7)
Eosinophils Relative: 2 %
HCT: 37.3 % (ref 35.0–47.0)
HEMOGLOBIN: 12.6 g/dL (ref 12.0–16.0)
LYMPHS ABS: 2.4 10*3/uL (ref 1.0–3.6)
LYMPHS PCT: 33 %
MCH: 30.4 pg (ref 26.0–34.0)
MCHC: 33.9 g/dL (ref 32.0–36.0)
MCV: 89.8 fL (ref 80.0–100.0)
Monocytes Absolute: 0.6 10*3/uL (ref 0.2–0.9)
Monocytes Relative: 8 %
NEUTROS ABS: 4.3 10*3/uL (ref 1.4–6.5)
NEUTROS PCT: 58 %
Platelets: 327 10*3/uL (ref 150–440)
RBC: 4.16 MIL/uL (ref 3.80–5.20)
RDW: 13.5 % (ref 11.5–14.5)
WBC: 7.5 10*3/uL (ref 3.6–11.0)

## 2015-11-07 NOTE — Progress Notes (Signed)
[place] @ [facility] Telephone:(336) [phone]  Fax:(336) [place]: 1946-04-21  MR#: [id number]  JJK#:[id number]  Patient Care Team: Pollyann Samples, MD as PCP - General (Family Medicine)  CHIEF COMPLAINT:  Chief Complaint  Patient presents with  . OTHER    69 year old lady with a history of ovarian cancer came today for the follow-up  No flowsheet data found.  INTERVAL HISTORY: [age]-year-old lady with a history of clear cell carcinoma focally.  No abdominal pain no nausea no vomiting patient continues to be very depressed continues to smoke had a routine screening CT scan was revealing a pulmonary nodule which needs follow-up Patient continues to smoke.  No abdominal pain no nausea no vomiting no diarrhea REVIEW OF SYSTEMS:    general status: Patient is feeling weak and tired.  No change in a performance status.  No chills.  No fever. HEENT: .  No evidence of stomatitis Lungs: No cough or shortness of breath Cardiac: No chest pain or paroxysmal nocturnal dyspnea GI: No nausea no vomiting no diarrhea no abdominal pain Skin: No rash Lower extremity no swelling Neurological system: No tingling.  No numbness.  No other focal signs Musculoskeletal system no bony pains  As per HPI. Otherwise, a complete review of systems is negatve.  PAST MEDICAL HISTORY: Past Medical History  Diagnosis Date  . Ovarian cancer ([place])   . Arthritis   . Goiter, nodular   . Goiter   . Ovarian cancer ([place]) 07/13/2015    PAST SURGICAL HISTORY: Past Surgical History  Procedure Laterality Date  . Ankle fracture surgery    . Lumbar fusion    . Rotator cuff repair      FAMILY HISTORY Family History  Problem Relation Age of Onset  . Alzheimer's disease Mother   . Tuberculosis Father     ADVANCED DIRECTIVES:  Patient does not have any living will or healthcare power of attorney.  Information was given .  Available resources had been discussed.  We will follow-up on  subsequent appointments regarding this issue  HEALTH MAINTENANCE: Social History  Substance Use Topics  . Smoking status: Current Every Day Smoker -- 0.50 packs/day for 50 years    Types: Cigarettes  . Smokeless tobacco: Never Used  . Alcohol Use: No      Allergies  Allergen Reactions  . Hydrocodone Shortness Of Breath  . Celecoxib Itching    Depression  . Sulfa Antibiotics   . Lorazepam Other (See Comments)    Patient states she wakes up and doesn't know what she is suppose to do that day.     Current Outpatient Prescriptions  Medication Sig Dispense Refill  . albuterol (PROAIR HFA) 108 (90 BASE) MCG/ACT inhaler Inhale 2 puffs into the lungs.    . benzonatate (TESSALON PERLES) 100 MG capsule Take 200 mg by mouth.    . bimatoprost (LUMIGAN) 0.01 % SOLN     . calcium carbonate (OS-CAL) 600 MG TABS tablet Take 600 mg by mouth.    . COMBIGAN 0.2-0.5 % ophthalmic solution PLACE 1 DROP IN LEFT EYE TWICE A DAY  2  . ergocalciferol (VITAMIN D2) 50000 UNITS capsule Take 1,000 Units by mouth.    . esomeprazole (NEXIUM) 40 MG capsule TAKE 1 CAPSULE (40 MG TOTAL) BY MOUTH 30 (THIRTY) MINUTES BEFORE BREAKFAST.    . fenofibrate (TRICOR) 145 MG tablet Take 1 tablet by mouth daily.    Marland Kitchen LORazepam (ATIVAN) 0.5 MG tablet Take 0.5 mg  by mouth as needed.    . naproxen (NAPROSYN) 375 MG tablet Take 375 mg by mouth.    Marland Kitchen PARoxetine (PAXIL) 10 MG tablet Take 10 mg by mouth.    . Umeclidinium Bromide (INCRUSE ELLIPTA) 62.5 MCG/INH AEPB Inhale 1 puff into the lungs.     No current facility-administered medications for this visit.    OBJECTIVE:  Filed Vitals:   11/07/15 1542  BP: 144/90  Pulse: 82  Temp: 96.5 F (35.8 C)     Body mass index is 27.63 kg/(m^2).    ECOG FS:1 - Symptomatic but completely ambulatory  PHYSICAL EXAM: General  status: Performance status is good.  Patient has not lost significant weight HEENT: No evidence of stomatitis. Sclera and conjunctivae :: No jaundice.    pale looking. Lungs: Air  entry equal on both sides.  No rhonchi.  No rales.  Cardiac: Heart sounds are normal.  No pericardial rub.  No murmur. Lymphatic system: Cervical, axillary, inguinal, lymph nodes not palpable GI: Abdomen is soft.  No ascites.  Liver spleen not palpable.  No tenderness.  Bowel sounds are within normal limit Lower extremity: No edema Neurological system: Higher functions, cranial nerves intact no evidence of peripheral neuropathy. Skin: No rash.  No ecchymosis.Marland Kitchen   LAB RESULTS:  Appointment on 11/07/2015  Component Date Value Ref Range Status  . WBC 11/07/2015 7.5  3.6 - 11.0 K/uL Final  . RBC 11/07/2015 4.16  3.80 - 5.20 MIL/uL Final  . Hemoglobin 11/07/2015 12.6  12.0 - 16.0 g/dL Final  . HCT 11/07/2015 37.3  35.0 - 47.0 % Final  . MCV 11/07/2015 89.8  80.0 - 100.0 fL Final  . MCH 11/07/2015 30.4  26.0 - 34.0 pg Final  . MCHC 11/07/2015 33.9  32.0 - 36.0 g/dL Final  . RDW 11/07/2015 13.5  11.5 - 14.5 % Final  . Platelets 11/07/2015 327  150 - 440 K/uL Final  . Neutrophils Relative % 11/07/2015 58   Final  . Neutro Abs 11/07/2015 4.3  1.4 - 6.5 K/uL Final  . Lymphocytes Relative 11/07/2015 33   Final  . Lymphs Abs 11/07/2015 2.4  1.0 - 3.6 K/uL Final  . Monocytes Relative 11/07/2015 8   Final  . Monocytes Absolute 11/07/2015 0.6  0.2 - 0.9 K/uL Final  . Eosinophils Relative 11/07/2015 2   Final  . Eosinophils Absolute 11/07/2015 0.1  0 - 0.7 K/uL Final  . Basophils Relative 11/07/2015 1   Final  . Basophils Absolute 11/07/2015 0.0  0 - 0.1 K/uL Final  . Sodium 11/07/2015 136  135 - 145 mmol/L Final  . Potassium 11/07/2015 3.7  3.5 - 5.1 mmol/L Final  . Chloride 11/07/2015 101  101 - 111 mmol/L Final  . CO2 11/07/2015 28  22 - 32 mmol/L Final  . Glucose, Bld 11/07/2015 94  65 - 99 mg/dL Final  . BUN 11/07/2015 12  6 - 20 mg/dL Final  . Creatinine, Ser 11/07/2015 0.70  0.44 - 1.00 mg/dL Final  . Calcium 11/07/2015 9.4  8.9 - 10.3 mg/dL Final  . Total  Protein 11/07/2015 7.1  6.5 - 8.1 g/dL Final  . Albumin 11/07/2015 4.4  3.5 - 5.0 g/dL Final  . AST 11/07/2015 23  15 - 41 U/L Final  . ALT 11/07/2015 8* 14 - 54 U/L Final  . Alkaline Phosphatase 11/07/2015 61  38 - 126 U/L Final  . Total Bilirubin 11/07/2015 0.5  0.3 - 1.2 mg/dL Final  . GFR calc non  Af Amer 11/07/2015 >60  >60 mL/min Final  . GFR calc Af Amer 11/07/2015 >60  >60 mL/min Final   Comment: (NOTE) The eGFR has been calculated using the CKD EPI equation. This calculation has not been validated in all clinical situations. eGFR's persistently <60 mL/min signify possible Chronic Kidney Disease.   . Anion gap 11/07/2015 7  5 - 15 Final    IMPRESSION: 1. Interval stability of three right upper lobe pulmonary nodules, largest 6 mm, for which six month stability has been demonstrated. Please note that the patient had a low dose lung screening chest CT on 02/22/2015. If the patient continues to qualify for low dose lung screening chest CT, a repeat low dose lung screening chest CT is advised in 12 months. Otherwise, a follow-up routine unenhanced chest CT is advised in 12-18 months. This recommendation follows the consensus statement: Guidelines for Management of Small Pulmonary Nodules Detected on CT Scans: A Statement from the [employer] as published in Radiology 2005;237:395-400. 2. Mild-to-moderate centrilobular emphysema and diffuse bronchial wall thickening, suggesting COPD. 3. Atherosclerosis, including left main and 3 vessel coronary artery disease. Please note that although the presence of coronary artery calcium documents the presence of coronary artery disease, the severity of this disease and any potential stenosis cannot be assessed on this non-gated CT examination. Assessment for potential risk factor modification, dietary therapy or pharmacologic therapy may be warranted, if clinically indicated. 4. Stable multinodular goiter, with a dominant 1.5 cm  right thyroid lobe hypodense nodule. Consider correlation with thyroid sonography   ASSESSMENT: Clear cell carcinoma from ovary on clinical examination there is no evidence of recurrent disease Chronic smoker has pulmonary nodule repeat CT scan has been ordered Nurse navigator  was informed to follow up on lung nodule  CT scan has been reviewed independently shows stable pulmonary nodule. Will repeat another CT scan in in March of [address]67 routine CT screening because patient continues to smoke Thyroid nodule needs to be evaluated by ultrasound CA-125 is stable there is no evidence of recurrent ovarian cancer  MEDICAL DECISION MAKING:  Lab data has been reviewed. Tumor markers are within acceptable range Total duration of visit was 45 minutes.  50% or more time was spent in counseling patient and family regarding prognosis and options of treatment and available resources guarding smoking as well as need for screening CT scan  Patient expressed understanding and was in agreement with this plan. She also understands that She can call clinic at any time with any questions, concerns, or complaints.    No matching staging information was found for the patient.  Forest Gleason, MD   11/07/2015 4:04 PM

## 2015-11-07 NOTE — Progress Notes (Signed)
Patient states she is having depression and wants to know if she can have something to help.  States she is having some problems within her family.

## 2015-11-08 LAB — CA 125: CA 125: 8.9 U/mL (ref 0.0–38.1)

## 2015-11-11 ENCOUNTER — Encounter: Payer: Self-pay | Admitting: Oncology

## 2015-11-11 DIAGNOSIS — J449 Chronic obstructive pulmonary disease, unspecified: Secondary | ICD-10-CM | POA: Insufficient documentation

## 2015-11-12 ENCOUNTER — Other Ambulatory Visit: Payer: Self-pay | Admitting: *Deleted

## 2015-11-12 DIAGNOSIS — E041 Nontoxic single thyroid nodule: Secondary | ICD-10-CM

## 2015-11-20 ENCOUNTER — Inpatient Hospital Stay (HOSPITAL_BASED_OUTPATIENT_CLINIC_OR_DEPARTMENT_OTHER): Payer: Medicare Other | Admitting: Obstetrics and Gynecology

## 2015-11-20 VITALS — BP 147/76 | HR 80 | Temp 98.3°F | Ht 68.0 in | Wt 183.4 lb

## 2015-11-20 DIAGNOSIS — Z79899 Other long term (current) drug therapy: Secondary | ICD-10-CM

## 2015-11-20 DIAGNOSIS — Z791 Long term (current) use of non-steroidal anti-inflammatories (NSAID): Secondary | ICD-10-CM

## 2015-11-20 DIAGNOSIS — C569 Malignant neoplasm of unspecified ovary: Secondary | ICD-10-CM

## 2015-11-20 DIAGNOSIS — J449 Chronic obstructive pulmonary disease, unspecified: Secondary | ICD-10-CM | POA: Diagnosis not present

## 2015-11-20 DIAGNOSIS — Z8543 Personal history of malignant neoplasm of ovary: Secondary | ICD-10-CM

## 2015-11-20 DIAGNOSIS — Z7951 Long term (current) use of inhaled steroids: Secondary | ICD-10-CM

## 2015-11-20 DIAGNOSIS — Z9221 Personal history of antineoplastic chemotherapy: Secondary | ICD-10-CM | POA: Diagnosis not present

## 2015-11-20 DIAGNOSIS — F17210 Nicotine dependence, cigarettes, uncomplicated: Secondary | ICD-10-CM | POA: Diagnosis not present

## 2015-11-20 NOTE — Progress Notes (Signed)
Gynecologic Oncology Interval Note  Referring Provider: Dr. Georgianne Fick and Dr. Oliva Bustard  Chief Concern: Ovarian cancer surveillance.   Subjective:  [name] is a 69 y.o. woman who presents today for continued surveillance for history of stage IB clear cell/serous ovarian cancer.   No new complaints today. Denies GI/GU symptoms.   Oncology Treatment History:   03/2012   presented with 10 cm pelvic mass and normal CA125 (16.2).  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  TAH/BSO and complete staging with four negative left pelvic nodes, 6 negative right pelvic nodes, 1 negative aortic node, negative omentum (Dr Sabra Heck and Dr Star Age). Carboplatin and paclitaxel x 4 with Dr Oliva Bustard, completed NED 07/2012  03/2014   back pain, but CT scan abd/pelvis negative 06/20/14    CA125 = 6.3 08/14/14  CT scan abd/pelvis negative  Problem List: Patient Active Problem List   Diagnosis Date Noted  . Chronic obstructive pulmonary disease ([place]) 11/11/2015  . Airway hyperreactivity 07/13/2015  . CAFL (chronic airflow limitation) ([place]) 07/13/2015  . Clinical depression 07/13/2015  . Fatigue 07/13/2015  . Glaucoma 07/13/2015  . Avitaminosis D 07/13/2015  . Ovarian cancer ([place]) 07/13/2015  . 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 Arthritis 12/05/2013  . 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 Breath shortness 10/28/2012  . Compulsive tobacco user syndrome 10/06/2012  . Current tobacco use 10/06/2012    Past Medical History: Past Medical History  Diagnosis Date  . Ovarian cancer ([place])   . Arthritis   . Goiter, nodular   . Goiter   . Ovarian cancer ([place]) 07/13/2015    Past Surgical History: Past Surgical History  Procedure Laterality Date  . Ankle fracture surgery    . Lumbar fusion    . Rotator cuff repair      Family History: Family History  Problem Relation Age of Onset  . Alzheimer's disease Mother    . Tuberculosis Father     Social History: Social History   Social History  . Marital Status: Widowed    Spouse Name: N/A  . Number of Children: N/A  . Years of Education: N/A   Occupational History  . Not on file.   Social History Main Topics  . Smoking status: Current Every Day Smoker -- 0.50 packs/day for 50 years    Types: Cigarettes  . Smokeless tobacco: Never Used  . Alcohol Use: No  . Drug Use: No  . Sexual Activity: Not on file   Other Topics Concern  . Not on file   Social History Narrative    Allergies: Allergies  Allergen Reactions  . Hydrocodone Shortness Of Breath  . Celecoxib Itching    Depression  . Sulfa Antibiotics   . Lorazepam Other (See Comments)    Patient states she wakes up and doesn't know what she is suppose to do that day.     Current Medications: Current Outpatient Prescriptions  Medication Sig Dispense Refill  . albuterol (PROAIR HFA) 108 (90 BASE) MCG/ACT inhaler Inhale 2 puffs into the lungs.    . benzonatate (TESSALON PERLES) 100 MG capsule Take 200 mg by mouth.    . bimatoprost (LUMIGAN) 0.01 % SOLN     . COMBIGAN 0.2-0.5 % ophthalmic solution PLACE 1 DROP IN LEFT EYE TWICE A DAY  2  . ergocalciferol (VITAMIN D2) 50000 UNITS capsule Take 1,000 Units by mouth.    . esomeprazole (NEXIUM) 40 MG capsule TAKE 1 CAPSULE (40  MG TOTAL) BY MOUTH 30 (THIRTY) MINUTES BEFORE BREAKFAST.    . fenofibrate (TRICOR) 145 MG tablet Take 1 tablet by mouth daily.    Marland Kitchen LORazepam (ATIVAN) 0.5 MG tablet Take 0.5 mg by mouth as needed.    . naproxen (NAPROSYN) 375 MG tablet Take 375 mg by mouth.    Marland Kitchen PARoxetine (PAXIL) 10 MG tablet Take 10 mg by mouth.    . Umeclidinium Bromide (INCRUSE ELLIPTA) 62.5 MCG/INH AEPB Inhale 1 puff into the lungs.    . calcium carbonate (OS-CAL) 600 MG TABS tablet Take 600 mg by mouth.     No current facility-administered medications for this visit.    Review of Systems A comprehensive review of systems was  negative.  Objective:  BP 147/76 mmHg  Pulse 80  Temp(Src) 98.3 F (36.8 C) (Oral)  Ht 5' 8"  (1.727 m)  Wt 183 lb 6.8 oz (83.2 kg)  BMI 27.90 kg/m2   ECOG Performance Status: 0 - Asymptomatic    General appearance: alert, cooperative and appears stated age HEENT:PERRLA, sclera clear, anicteric, neck supple with midline trachea and thyroid without masses Lymph node survey: negative Cardiovascular: normal rate and rhythm, no murmurs Respiratory: ronhci at bases bilaterally Breast exam: not done. Abdomen: soft, no masses or hernias. Non tender. Back: inspection of back is normal Extremities: no edema or lesions  Skin exam: normal coloration and turgor, no rashes, no suspicious skin lesions noted. Neurological exam reveals: alert, oriented, normal speech, no focal findings or movement disorder noted.  Pelvic: exam chaperoned by nurse;  Vulva: normal appearing vulva with no masses, tenderness or lesions; Vagina: normal; Cervix: normal; Adnexa: nontender, ; Uterus: uterus is normal size, shape, consistency and nontender;  Rectal: confirms.  Lab Review CA-125 pending. Lab Results  Component Value Date   CA125 8.9 11/07/2015   CA125 7.2 07/09/2015   CA125 6.7 05/29/2015    Assessment:  [name] is a 69 y.o. 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  Plan:   Problem List Items Addressed This Visit      Genitourinary   Ovarian cancer ([place]) - Primary      She will then RTC in 6 months to see [place] with CA125.     She will call in the interval with any concerning symptoms.  CT scan can be performed if any rise in CA125 or concerning signs or symptoms. She is comfortable with the plan and had her questions answered.    Patient should have genetic testing for BRCA1/2.  Patient will speak with her daughter about [facility] test as this could have implications for the whole family and will get back to [place]  next week.  Mellody Drown, MD  CC:  Pollyann Samples, MD [address] [place], [place] [address] [phone]

## 2015-11-27 ENCOUNTER — Ambulatory Visit: Payer: Medicare Other

## 2016-05-05 ENCOUNTER — Ambulatory Visit
Admission: RE | Admit: 2016-05-05 | Discharge: 2016-05-05 | Disposition: A | Discharge status: Home / Self Care | Payer: Medicare HMO | Source: Ambulatory Visit | Attending: Oncology | Admitting: Oncology

## 2016-05-05 DIAGNOSIS — E042 Nontoxic multinodular goiter: Secondary | ICD-10-CM | POA: Diagnosis not present

## 2016-05-05 DIAGNOSIS — E041 Nontoxic single thyroid nodule: Secondary | ICD-10-CM

## 2016-05-07 ENCOUNTER — Telehealth: Payer: Self-pay | Admitting: *Deleted

## 2016-05-07 ENCOUNTER — Other Ambulatory Visit: Payer: Medicare Other

## 2016-05-07 ENCOUNTER — Ambulatory Visit: Payer: Medicare Other | Admitting: Oncology

## 2016-05-07 ENCOUNTER — Inpatient Hospital Stay (HOSPITAL_BASED_OUTPATIENT_CLINIC_OR_DEPARTMENT_OTHER): Payer: Medicare HMO | Admitting: Family Medicine

## 2016-05-07 ENCOUNTER — Encounter: Payer: Self-pay | Admitting: Family Medicine

## 2016-05-07 ENCOUNTER — Inpatient Hospital Stay: Payer: Medicare HMO | Attending: Family Medicine

## 2016-05-07 VITALS — BP 142/74 | HR 68 | Temp 97.2°F | Resp 19 | Wt 180.1 lb

## 2016-05-07 DIAGNOSIS — R0602 Shortness of breath: Secondary | ICD-10-CM | POA: Insufficient documentation

## 2016-05-07 DIAGNOSIS — Z90710 Acquired absence of both cervix and uterus: Secondary | ICD-10-CM | POA: Insufficient documentation

## 2016-05-07 DIAGNOSIS — R918 Other nonspecific abnormal finding of lung field: Secondary | ICD-10-CM | POA: Diagnosis not present

## 2016-05-07 DIAGNOSIS — M1990 Unspecified osteoarthritis, unspecified site: Secondary | ICD-10-CM | POA: Insufficient documentation

## 2016-05-07 DIAGNOSIS — C562 Malignant neoplasm of left ovary: Secondary | ICD-10-CM

## 2016-05-07 DIAGNOSIS — E079 Disorder of thyroid, unspecified: Secondary | ICD-10-CM | POA: Insufficient documentation

## 2016-05-07 DIAGNOSIS — Z8543 Personal history of malignant neoplasm of ovary: Secondary | ICD-10-CM | POA: Diagnosis not present

## 2016-05-07 DIAGNOSIS — R062 Wheezing: Secondary | ICD-10-CM | POA: Insufficient documentation

## 2016-05-07 DIAGNOSIS — Z90722 Acquired absence of ovaries, bilateral: Secondary | ICD-10-CM | POA: Diagnosis not present

## 2016-05-07 DIAGNOSIS — Z9221 Personal history of antineoplastic chemotherapy: Secondary | ICD-10-CM | POA: Diagnosis not present

## 2016-05-07 DIAGNOSIS — Z79899 Other long term (current) drug therapy: Secondary | ICD-10-CM | POA: Diagnosis not present

## 2016-05-07 DIAGNOSIS — F17210 Nicotine dependence, cigarettes, uncomplicated: Secondary | ICD-10-CM | POA: Insufficient documentation

## 2016-05-07 DIAGNOSIS — C569 Malignant neoplasm of unspecified ovary: Secondary | ICD-10-CM

## 2016-05-07 LAB — CBC WITH DIFFERENTIAL/PLATELET
BASOS ABS: 0.1 10*3/uL (ref 0–0.1)
BASOS PCT: 1 %
EOS ABS: 0.2 10*3/uL (ref 0–0.7)
EOS PCT: 2 %
HCT: 38 % (ref 35.0–47.0)
Hemoglobin: 13 g/dL (ref 12.0–16.0)
LYMPHS PCT: 31 %
Lymphs Abs: 2.3 10*3/uL (ref 1.0–3.6)
MCH: 30.6 pg (ref 26.0–34.0)
MCHC: 34.4 g/dL (ref 32.0–36.0)
MCV: 88.9 fL (ref 80.0–100.0)
Monocytes Absolute: 0.5 10*3/uL (ref 0.2–0.9)
Monocytes Relative: 7 %
Neutro Abs: 4.3 10*3/uL (ref 1.4–6.5)
Neutrophils Relative %: 59 %
PLATELETS: 321 10*3/uL (ref 150–440)
RBC: 4.27 MIL/uL (ref 3.80–5.20)
RDW: 13.4 % (ref 11.5–14.5)
WBC: 7.4 10*3/uL (ref 3.6–11.0)

## 2016-05-07 LAB — COMPREHENSIVE METABOLIC PANEL
ALBUMIN: 4.8 g/dL (ref 3.5–5.0)
ALT: 9 U/L — AB (ref 14–54)
AST: 24 U/L (ref 15–41)
Alkaline Phosphatase: 68 U/L (ref 38–126)
Anion gap: 5 (ref 5–15)
BUN: 13 mg/dL (ref 6–20)
CHLORIDE: 103 mmol/L (ref 101–111)
CO2: 27 mmol/L (ref 22–32)
CREATININE: 0.76 mg/dL (ref 0.44–1.00)
Calcium: 9.3 mg/dL (ref 8.9–10.3)
GFR calc Af Amer: >60 mL/min (ref >60)
Glucose, Bld: 78 mg/dL (ref 65–99)
POTASSIUM: 3.6 mmol/L (ref 3.5–5.1)
SODIUM: 135 mmol/L (ref 135–145)
Total Bilirubin: 0.7 mg/dL (ref 0.3–1.2)
Total Protein: 7.6 g/dL (ref 6.5–8.1)

## 2016-05-07 MED ORDER — ALBUTEROL SULFATE (2.5 MG/3ML) 0.083% IN NEBU
2.5000 mg | INHALATION_SOLUTION | Freq: Four times a day (QID) | RESPIRATORY_TRACT | Status: AC | PRN
Start: 2016-05-07 — End: ?

## 2016-05-07 NOTE — Progress Notes (Signed)
[facility]  [phone]  Fax:([phone]) [place] DOB: 1946/01/15  MR#: [id number]  LKG#:[id number]  Patient Care Team: Pollyann Samples, MD as PCP - General (Family Medicine)  CHIEF COMPLAINT:  Chief Complaint  Patient presents with  . Follow-up    Ovarian Cancer    INTERVAL HISTORY:  Patient is here for further follow-up regarding history of a stage I ovarian cancer. Patient had a TAHBSO in 2013. She has recently seen Dr. Fransisca Connors and continues to follow with him regularly. Patient now resides in [place] and primary care provider continues to follow her every 6 months. Patient recently had an ultrasound of the thyroid for follow-up regarding the thyroid nodule. It was recommended that this be repeated in 6-12 months.Patient has also been enrolled in the low dose CT screening for lung cancer program and is due for her yearly evaluation in September 2017. Patient continues to smoke and reports some shortness of breath with wheezing but mostly with exertion. She is requesting a nebulizer and SVN treatments to use at home. Otherwise she reports feeling very well and denies any acute complaints.  REVIEW OF SYSTEMS:   Review of Systems  Constitutional: Negative for fever, chills, weight loss, malaise/fatigue and diaphoresis.  HENT: Negative.   Eyes: Negative.   Respiratory: Positive for wheezing. Negative for cough, hemoptysis, sputum production and shortness of breath.   Cardiovascular: Negative for chest pain, palpitations, orthopnea, claudication, leg swelling and PND.  Gastrointestinal: Negative for heartburn, nausea, vomiting, abdominal pain, diarrhea, constipation, blood in stool and melena.  Genitourinary: Negative.   Musculoskeletal: Negative.   Skin: Negative.   Neurological: Negative for dizziness, tingling, focal weakness, seizures and weakness.  Endo/Heme/Allergies: Does not bruise/bleed easily.  Psychiatric/Behavioral: Negative for  depression. The patient is not nervous/anxious and does not have insomnia.     As per HPI. Otherwise, a complete review of systems is negatve.  ONCOLOGY HISTORY:   Ovarian cancer ([place])   06/20/2012 Initial Diagnosis Ovarian cancer, stage Ib, clear cell adenocarcinoma    Surgery TAHBSO and complete staging    - 08/08/2012 Chemotherapy completed adjuvant chemotherapy with Carboplatin and Taxol x 4 cycles.   03/22/2015 Imaging LDCT screening revealed 49m lung nodule, chronic smoker    PAST MEDICAL HISTORY: Past Medical History  Diagnosis Date  . [place]varian cancer ([place]   . Arthritis   . Goiter, nodular   . Goiter   . Ovarian cancer ([place] 07/13/2015    PAST SURGICAL HISTORY: Past Surgical History  Procedure Laterality Date  . Ankle fracture surgery    . Lumbar fusion    . Rotator cuff repair      FAMILY HISTORY Family History  Problem Relation Age of Onset  . Alzheimer's disease Mother   . Tuberculosis Father     GYNECOLOGIC HISTORY:  No LMP recorded.     ADVANCED DIRECTIVES:    HEALTH MAINTENANCE: Social History  Substance Use Topics  . Smoking status: Current Every Day Smoker -- 0.50 packs/day for 50 years    Types: Cigarettes  . Smokeless tobacco: Never Used  . Alcohol Use: No     Allergies  Allergen Reactions  . Hydrocodone Shortness Of Breath  . Celecoxib Itching    Depression  . Sulfa Antibiotics   . Lorazepam Other (See Comments)    Patient states she wakes up and doesn't know what she is suppose to do that day.     Current Outpatient Prescriptions  Medication Sig Dispense  Refill  . albuterol (PROAIR HFA) 108 (90 BASE) MCG/ACT inhaler Inhale 2 puffs into the lungs.    . bimatoprost (LUMIGAN) 0.01 % SOLN     . calcium carbonate (OS-CAL) 600 MG TABS tablet Take 600 mg by mouth.    . COMBIGAN 0.2-0.5 % ophthalmic solution PLACE 1 DROP IN LEFT EYE TWICE A DAY  2  . ergocalciferol (VITAMIN D2) 50000 UNITS capsule Take 1,000 Units by mouth.    .  esomeprazole (NEXIUM) 40 MG capsule TAKE 1 CAPSULE (40 MG TOTAL) BY MOUTH 30 (THIRTY) MINUTES BEFORE BREAKFAST.    . fenofibrate (TRICOR) 145 MG tablet Take 1 tablet by mouth daily.    Marland Kitchen LORazepam (ATIVAN) 0.5 MG tablet Take 0.5 mg by mouth as needed.    Marland Kitchen PARoxetine (PAXIL) 10 MG tablet Take 10 mg by mouth.    . Umeclidinium Bromide (INCRUSE ELLIPTA) 62.5 MCG/INH AEPB Inhale 1 puff into the lungs.    Marland Kitchen albuterol (PROVENTIL) (2.5 MG/3ML) 0.083% nebulizer solution Take 3 mLs (2.5 mg total) by nebulization every 6 (six) hours as needed for wheezing or shortness of breath. 75 mL 12  . benzonatate (TESSALON PERLES) 100 MG capsule Take 200 mg by mouth. Reported on 05/07/2016     No current facility-administered medications for this visit.    OBJECTIVE: BP 142/74 mmHg  Pulse 68  Temp(Src) 97.2 F (36.2 C)  Resp 19  Wt 180 lb 1.9 oz (81.7 kg)   Body mass index is 27.39 kg/(m^2).    ECOG FS:1 - Symptomatic but completely ambulatory  General: Well-developed, well-nourished, no acute distress. Eyes: Pink conjunctiva, anicteric sclera. HEENT: Normocephalic, moist mucous membranes, clear oropharnyx. Lungs: Diminished bilaterally otherwise clear to auscultation bilaterally. Heart: Regular rate and rhythm. No rubs, murmurs, or gallops. Abdomen: Soft, nontender, nondistended. No organomegaly noted, normoactive bowel sounds. Musculoskeletal: No edema, cyanosis, or clubbing. Neuro: Alert, answering all questions appropriately. Cranial nerves grossly intact. Skin: No rashes or petechiae noted. Psych: Normal affect. Lymphatics: No cervical, clavicular LAD.   LAB RESULTS:  Appointment on 05/07/2016  Component Date Value Ref Range Status  . WBC 05/07/2016 7.4  3.6 - 11.0 K/uL Final  . RBC 05/07/2016 4.27  3.80 - 5.20 MIL/uL Final  . Hemoglobin 05/07/2016 13.0  12.0 - 16.0 g/dL Final  . HCT 05/07/2016 38.0  35.0 - 47.0 % Final  . MCV 05/07/2016 88.9  80.0 - 100.0 fL Final  . MCH 05/07/2016 30.6   26.0 - 34.0 pg Final  . MCHC 05/07/2016 34.4  32.0 - 36.0 g/dL Final  . RDW 05/07/2016 13.4  11.5 - 14.5 % Final  . Platelets 05/07/2016 321  150 - 440 K/uL Final  . Neutrophils Relative % 05/07/2016 59   Final  . Neutro Abs 05/07/2016 4.3  1.4 - 6.5 K/uL Final  . Lymphocytes Relative 05/07/2016 31   Final  . Lymphs Abs 05/07/2016 2.3  1.0 - 3.6 K/uL Final  . Monocytes Relative 05/07/2016 7   Final  . Monocytes Absolute 05/07/2016 0.5  0.2 - 0.9 K/uL Final  . Eosinophils Relative 05/07/2016 2   Final  . Eosinophils Absolute 05/07/2016 0.2  0 - 0.7 K/uL Final  . Basophils Relative 05/07/2016 1   Final  . Basophils Absolute 05/07/2016 0.1  0 - 0.1 K/uL Final  . Sodium 05/07/2016 135  135 - 145 mmol/L Final  . Potassium 05/07/2016 3.6  3.5 - 5.1 mmol/L Final  . Chloride 05/07/2016 103  101 - 111 mmol/L Final  .  CO2 05/07/2016 27  22 - 32 mmol/L Final  . Glucose, Bld 05/07/2016 78  65 - 99 mg/dL Final  . BUN 05/07/2016 13  6 - 20 mg/dL Final  . Creatinine, Ser 05/07/2016 0.76  0.44 - 1.00 mg/dL Final  . Calcium 05/07/2016 9.3  8.9 - 10.3 mg/dL Final  . Total Protein 05/07/2016 7.6  6.5 - 8.1 g/dL Final  . Albumin 05/07/2016 4.8  3.5 - 5.0 g/dL Final  . AST 05/07/2016 24  15 - 41 U/L Final  . ALT 05/07/2016 9* 14 - 54 U/L Final  . Alkaline Phosphatase 05/07/2016 68  38 - 126 U/L Final  . Total Bilirubin 05/07/2016 0.7  0.3 - 1.2 mg/dL Final  . GFR calc non Af Amer 05/07/2016 >60  >60 mL/min Final  . GFR calc Af Amer 05/07/2016 >60  >60 mL/min Final   Comment: (NOTE) The eGFR has been calculated using the CKD EPI equation. This calculation has not been validated in all clinical situations. eGFR's persistently <60 mL/min signify possible Chronic Kidney Disease.   . Anion gap 05/07/2016 5  5 - 15 Final    STUDIES: US Soft Tissue Head/neck  05/05/2016  CLINICAL DATA:  70 year old female with a history of thyroid nodule follow-up. EXAM: THYROID ULTRASOUND TECHNIQUE: Ultrasound  examination of the thyroid gland and adjacent soft tissues was performed. COMPARISON:  None. FINDINGS: Right thyroid lobe Measurements: 4.7 cm x 1.8 cm x 1.7 cm. Relatively homogeneous appearance of right thyroid. Dominant right thyroid nodule measures 1.8 cm x 1.3 cm x 1.7 cm. No internal calcifications. Well-defined margin with spongiform configuration. Adjacent small nodule measures 7 mm. Left thyroid lobe Measurements: 4.4 cm x 1.9 cm x 1.6 cm. Relatively homogeneous appearance of left thyroid tissue. Dominant left nodule measures 1.3 cm x 1.0 cm x 1.1 cm. Additional nodule at the inferior left thyroid measures 1.1 cm x 1.2 cm x 1.1 cm. Small mid left thyroid nodule measures 1.1 cm x 4 mm x 8 mm Isthmus Thickness: 5 mm.  No nodules visualized. Lymphadenopathy None visualized. IMPRESSION: Multinodular thyroid. Majority of nodules do not meet criteria for biopsy. Single right thyroid nodule is suspicious only by size, measuring 1.8 cm, with otherwise benign features. Given the patient's age, either six-month to 87 month surveillance or biopsy may be considered, as suggested by current SRU guidelines. Signed, Dulcy Fanny. Earleen Newport, DO [facility] Radiology Electronically Signed   By: Corrie Mckusick D.O.   On: 05/05/2016 15:31    ASSESSMENT:  Clear cell carcinoma of the ovary, stage I, T1 N0 M0. Abnormal CT scan of lungs. Abnormal thyroid nodule.  PLAN:   1. Ovarian cancer. Patient is status post TAH/BSO. Clinically there is no evidence of recurrent disease. Patient continues to follow with Dr. Fransisca Connors. 2. Abnormal CT of the lung. Patient continues to smoke. Her initial low dose CT screening for lung cancer scan revealed pulmonary nodules. Her most recent follow-up scan in September 2016 revealed stable pulmonary nodules. She was then recommended to have repeat imaging in approximately one year. This will need to be scheduled for September 2017. Patient again  counseled on smoking cessation. We will see and prescription for small volume nebulizer machine as well as albuterol nebulizers to a pharmacy local for this patient who lives in [place]. 3. Thyroid nodule seen on CT scan. Ultrasound has been performed. Revealed multinodular thyroid. Majority of nodules did not meet criteria for biopsy but there was a single right thyroid nodule  that is suspicious only by size and measures 1.8 cm but otherwise has benign features. It was recommended by radiology to either follow in 6-12 months for surveillance.  Patient will be evaluated again in 6 months and thyroid ultrasound can be scheduled at that time.  Patient expressed understanding and was in agreement with this plan. She also understands that She can call clinic at any time with any questions, concerns, or complaints.   Dr. Oliva Bustard was available for consultation and review of plan of care for this patient.  Ovarian cancer [facility])   Staging form: Ovary, AJCC 7th Edition     Clinical stage from 04/07/2012: Stage I (T1, N0, M0) - Signed by Evlyn Kanner, NP on 05/07/2016   Evlyn Kanner, NP   05/07/2016 3:08 PM

## 2016-05-07 NOTE — Progress Notes (Signed)
Here today for ongoing evaluation of Ovarian Cancer. Pt has no complaints to offer. Feels well. Appetite is good. Energy is fairly good.

## 2016-05-07 NOTE — Telephone Encounter (Signed)
[name] called to inform us that patient has [employer] and their company is not contracted with [employer], therefore they will not be able to fill order.

## 2016-05-07 NOTE — Telephone Encounter (Signed)
Left message for patient to pick up her albuterol solution from her pharmacy ([facility] in [place]), she will need to pick up her nebulizer machine from [facility] in [place] on [address].  I faxed prescription and insurance/demographics to [facility] at [phone]. If she has any questions requested she call back to the Cancer center.

## 2016-05-08 ENCOUNTER — Telehealth: Payer: Self-pay | Admitting: *Deleted

## 2016-05-08 LAB — CA 125: CA 125: 8.3 U/mL (ref 0.0–38.1)

## 2016-05-08 NOTE — Telephone Encounter (Signed)
Called patient and left message that [facility] could not fill prescription for Nebulizer because they do not accept [employer].  Prescription has been faxed to [facility] on [address].  Paitent should check with them later today to pick up prescription.

## 2016-05-11 NOTE — Telephone Encounter (Signed)
Erroneous error

## 2016-05-19 ENCOUNTER — Telehealth: Payer: Self-pay

## 2016-05-19 NOTE — Telephone Encounter (Signed)
  Oncology Nurse Navigator Documentation  Navigator Location: [facility] (05/19/16 1000) Navigator Encounter Type: Telephone (05/19/16 1000) Telephone: Appt Confirmation/Clarification (05/19/16 1000)                                        Time Spent with Patient: 15 (05/19/16 1000)   Called patient to confirm appt tomorrow with Dr Theora Gianotti. Has only seen Dr Fransisca Connors in the past. She is fine with seeing Dr Theora Gianotti. Cannot come in any earlier than 1500.

## 2016-05-20 ENCOUNTER — Ambulatory Visit: Payer: Medicare Other

## 2016-05-20 ENCOUNTER — Encounter: Payer: Self-pay | Admitting: Obstetrics and Gynecology

## 2016-05-20 ENCOUNTER — Inpatient Hospital Stay (HOSPITAL_BASED_OUTPATIENT_CLINIC_OR_DEPARTMENT_OTHER): Payer: Medicare HMO | Admitting: Obstetrics and Gynecology

## 2016-05-20 VITALS — BP 146/87 | HR 82 | Temp 97.0°F | Ht 69.0 in | Wt 179.7 lb

## 2016-05-20 DIAGNOSIS — Z90710 Acquired absence of both cervix and uterus: Secondary | ICD-10-CM

## 2016-05-20 DIAGNOSIS — Z8543 Personal history of malignant neoplasm of ovary: Secondary | ICD-10-CM

## 2016-05-20 DIAGNOSIS — Z90722 Acquired absence of ovaries, bilateral: Secondary | ICD-10-CM

## 2016-05-20 NOTE — Progress Notes (Signed)
  Oncology Nurse Navigator Documentation  Navigator Location: [facility] (05/20/16 1500) Navigator Encounter Type: [facility] Follow-up (05/20/16 1500)                                          Time Spent with Patient: 30 (05/20/16 1500)   Chaperoned pelvic exam. Will follow up with patient regarding genetic testing.

## 2016-05-20 NOTE — Progress Notes (Signed)
Gynecologic Oncology Interval Note  Referring Provider: Dr. Georgianne Fick and Dr. Oliva Bustard  Chief Concern: Ovarian cancer surveillance.   Subjective:  [name] is a 70 y.o. woman who presents today for continued surveillance for history of stage IB clear cell/serous ovarian cancer.   No new complaints today. Denies GI/GU symptoms. She was last seen by Dr. Fransisca Connors 11/20/2015 and had a negative exam. She was just seen by Georgeanne Nim NP 05/07/2016 and presents today for a pelvic exam.   When Dr. Fransisca Connors saw her last he recommended genetic testing which she has not yet completed.   Lab Results  Component Value Date   CA125 8.3 05/07/2016    09/12/2015 CT chest 1. Interval stability of three right upper lobe pulmonary nodules, largest 6 mm, for which six month stability has been demonstrated. Please note that the patient had a low dose lung screening chest CT on 02/22/2015. If the patient continues to qualify for low dose lung screening chest CT, a repeat low dose lung screening chest CT is advised in 12 months. Otherwise, a follow-up routine unenhanced chest CT is advised in 12-18 months. This recommendation follows the consensus statement: Guidelines for Management of Small Pulmonary Nodules Detected on CT Scans: A Statement from the [employer] as published in Radiology 2005;237:395-400. 2. Mild-to-moderate centrilobular emphysema and diffuse bronchial wall thickening, suggesting COPD. 3. Atherosclerosis, including left main and 3 vessel coronary artery disease. Please note that although the presence of coronary artery calcium documents the presence of coronary artery disease, the severity of this disease and any potential stenosis cannot be assessed on this non-gated CT examination. Assessment for potential risk factor modification, dietary therapy or pharmacologic therapy may be warranted, if clinically indicated. 4. Stable multinodular goiter, with a dominant 1.5 cm  right thyroid lobe hypodense nodule. Consider correlation with thyroid sonography.   Oncology Treatment History:   03/2012   presented with 10 cm pelvic mass and normal CA125 (16.2).  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  TAH/BSO and complete staging with four negative left pelvic nodes, 6 negative right pelvic nodes, 1 negative aortic node, negative omentum (Dr Sabra Heck and Dr Star Age). Carboplatin and paclitaxel x 4 with Dr Oliva Bustard, completed NED 07/2012  03/2014   back pain, but CT scan abd/pelvis negative 06/20/14    CA125 = 6.3 08/14/14  CT scan abd/pelvis negative 02/22/2015 Chest CT   6 mm irregular nodule in the posterior right lung apex. Lung-RADS  Category 3, probably benign findings. Short-term follow up in 6  months is recommended with low-dose chest CT without contrast.  Additional small right upper lobe nodules measuring 3-4 mm.   Problem List: Patient Active Problem List   Diagnosis Date Noted  . Chronic obstructive pulmonary disease ([place]) 11/11/2015  . Airway hyperreactivity 07/13/2015  . CAFL (chronic airflow limitation) ([place]) 07/13/2015  . Clinical depression 07/13/2015  . Fatigue 07/13/2015  . Glaucoma 07/13/2015  . Avitaminosis D 07/13/2015  . Ovarian cancer ([place]) 07/13/2015  . 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 Arthritis 12/05/2013  . 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 Breath shortness 10/28/2012  . Compulsive tobacco user syndrome 10/06/2012  . Current tobacco use 10/06/2012    Past Medical History: Past Medical History  Diagnosis Date  . Ovarian cancer ([place])   . Arthritis   . Goiter, nodular   . Goiter   . Ovarian cancer ([place]) 07/13/2015    Past Surgical History: Past Surgical  History  Procedure Laterality Date  . Ankle fracture surgery    . Lumbar fusion    . Rotator cuff repair      Family History: Family History  Problem Relation Age of Onset   . Alzheimer's disease Mother   . Tuberculosis Father     Social History: Social History   Social History  . Marital Status: Widowed    Spouse Name: N/A  . Number of Children: N/A  . Years of Education: N/A   Occupational History  . Not on file.   Social History Main Topics  . Smoking status: Current Every Day Smoker -- 0.50 packs/day for 50 years    Types: Cigarettes  . Smokeless tobacco: Never Used  . Alcohol Use: No  . Drug Use: No  . Sexual Activity: Not on file   Other Topics Concern  . Not on file   Social History Narrative    Allergies: Allergies  Allergen Reactions  . Hydrocodone Shortness Of Breath  . Celecoxib Itching and Other (See Comments)    Depression Depression  . Sulfa Antibiotics   . Lorazepam Other (See Comments)    Patient states she wakes up and doesn't know what she is suppose to do that day.     Current Medications: Current Outpatient Prescriptions  Medication Sig Dispense Refill  . albuterol (PROAIR HFA) 108 (90 BASE) MCG/ACT inhaler Inhale 2 puffs into the lungs.    Marland Kitchen albuterol (PROVENTIL) (2.5 MG/3ML) 0.083% nebulizer solution Take 3 mLs (2.5 mg total) by nebulization every 6 (six) hours as needed for wheezing or shortness of breath. 75 mL 12  . benzonatate (TESSALON PERLES) 100 MG capsule Take 200 mg by mouth. Reported on 05/07/2016    . bimatoprost (LUMIGAN) 0.01 % SOLN     . calcium carbonate (OS-CAL) 600 MG TABS tablet Take 600 mg by mouth.    . COMBIGAN 0.2-0.5 % ophthalmic solution PLACE 1 DROP IN LEFT EYE TWICE A DAY  2  . ergocalciferol (VITAMIN D2) 50000 UNITS capsule Take 1,000 Units by mouth.    . esomeprazole (NEXIUM) 40 MG capsule TAKE 1 CAPSULE (40 MG TOTAL) BY MOUTH 30 (THIRTY) MINUTES BEFORE BREAKFAST.    . fenofibrate (TRICOR) 145 MG tablet Take 1 tablet by mouth daily.    Marland Kitchen LORazepam (ATIVAN) 0.5 MG tablet Take 0.5 mg by mouth as needed.    Marland Kitchen PARoxetine (PAXIL) 10 MG tablet Take 10 mg by mouth.    . Umeclidinium  Bromide (INCRUSE ELLIPTA) 62.5 MCG/INH AEPB Inhale 1 puff into the lungs.     No current facility-administered medications for this visit.    Review of Systems General: no complaints  HEENT: no complaints  Lungs: occasional cough  Cardiac: no complaints  GI: no complaints  GU: no complaints  Musculoskeletal: no complaints  Extremities: occasional leg swelling  Skin: no complaints  Neuro: no complaints  Endocrine: no complaints  Psych: depression      Objective:  BP 146/87 mmHg  Pulse 82  Temp(Src) 97 F (36.1 C) (Tympanic)  Ht 5\' 9"  (1.753 m)  Wt 179 lb 10.8 oz (81.5 kg)  BMI 26.52 kg/m2   ECOG Performance Status: 0 - Asymptomatic    General appearance: alert, cooperative and appears stated age HEENT:PERRLA, sclera clear, anicteric,  Abdomen: soft, no masses or hernias. Non tender. Extremities: no edema or lesions  Neurological exam reveals: alert, oriented, normal speech, no focal findings or movement disorder noted.  Pelvic: exam chaperoned by nurse;  Vulva: normal  appearing vulva with no masses, tenderness or lesions; Vagina: normal; Cervix: normal; Adnexa: nontender, ; Uterus: uterus is normal size, shape, consistency and nontender;  Rectal: deferred  Lab Review CA-125 pending. Lab Results  Component Value Date   CA125 8.3 05/07/2016   CA125 8.9 11/07/2015   CA125 7.2 07/09/2015    Assessment:  [name] is a 70 y.o. 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  Plan:   Problem List Items Addressed This Visit    None    Visit Diagnoses    History of ovarian cancer    -  Primary       We will continue to alternate visit with Georgeanne Nim NP or Dr. Delight Hoh every 6 month intervals. Plan for CA125 every 6 months.     She will call in the interval with any concerning symptoms.  CT scan can be performed if any rise in CA125 or concerning signs or  symptoms. She is comfortable with the plan and had her questions answered.    She is interested in genetic testing but is concerned about the financial aspects. Our team will try to determine if [facility] will provide coverage. The patient will speak with her daughter about testing.   We also provided information regarding smoking cessation.    Gillis Ends, MD  CC:  Dr. Georgianne Fick and Dr. Oliva Bustard

## 2016-08-27 ENCOUNTER — Telehealth: Payer: Self-pay | Admitting: *Deleted

## 2016-08-27 NOTE — Telephone Encounter (Signed)
Left voicemail for patient notifyng them that it is time to schedule annual low dose lung cancer screening CT scan. Instructed patient to call back to verify information prior to the scan being scheduled.  

## 2016-09-08 ENCOUNTER — Telehealth: Payer: Self-pay | Admitting: *Deleted

## 2016-09-08 NOTE — Telephone Encounter (Signed)
Patient returned call, notified patient that lung cancer screening CT scan is due. Confirmed that patient is within the age range of 55-77, and asymptomatic, (no signs or symptoms of lung cancer). Patient denies illness that would prevent curative treatment for lung cancer if found. The patient is a current smoker, with a 40.5 pack year history. The shared decision making visit was done 02/22/15. Patient is agreeable for CT scan being scheduled.

## 2016-09-11 ENCOUNTER — Other Ambulatory Visit: Payer: Self-pay | Admitting: *Deleted

## 2016-09-11 DIAGNOSIS — Z87891 Personal history of nicotine dependence: Secondary | ICD-10-CM

## 2016-09-21 ENCOUNTER — Ambulatory Visit
Admission: RE | Admit: 2016-09-21 | Discharge: 2016-09-21 | Disposition: A | Discharge status: Home / Self Care | Payer: Medicare HMO | Source: Ambulatory Visit | Attending: Oncology | Admitting: Oncology

## 2016-09-21 DIAGNOSIS — E041 Nontoxic single thyroid nodule: Secondary | ICD-10-CM | POA: Diagnosis not present

## 2016-09-21 DIAGNOSIS — J439 Emphysema, unspecified: Secondary | ICD-10-CM | POA: Diagnosis not present

## 2016-09-21 DIAGNOSIS — K314 Gastric diverticulum: Secondary | ICD-10-CM | POA: Insufficient documentation

## 2016-09-21 DIAGNOSIS — I2510 Atherosclerotic heart disease of native coronary artery without angina pectoris: Secondary | ICD-10-CM | POA: Insufficient documentation

## 2016-09-21 DIAGNOSIS — I517 Cardiomegaly: Secondary | ICD-10-CM | POA: Diagnosis not present

## 2016-09-21 DIAGNOSIS — I700 Atherosclerosis of aorta: Secondary | ICD-10-CM | POA: Diagnosis not present

## 2016-09-21 DIAGNOSIS — F17210 Nicotine dependence, cigarettes, uncomplicated: Secondary | ICD-10-CM | POA: Diagnosis not present

## 2016-09-21 DIAGNOSIS — Z122 Encounter for screening for malignant neoplasm of respiratory organs: Secondary | ICD-10-CM | POA: Insufficient documentation

## 2016-09-21 DIAGNOSIS — Z87891 Personal history of nicotine dependence: Secondary | ICD-10-CM

## 2016-10-01 ENCOUNTER — Telehealth: Payer: Self-pay | Admitting: *Deleted

## 2016-10-01 NOTE — Telephone Encounter (Signed)
Voicemail left to notifiy patient of LDCT lung cancer screening results with recommendation for 12 month follow up imaging. Also upon call back will be notified of incidental finding noted below. This note will be sent to PCP via Epic.  IMPRESSION: 1. Lung-RADS Category 2, benign appearance or behavior. Continue annual screening with low-dose chest CT without contrast in 12 months. 2.  Coronary artery atherosclerosis. Aortic atherosclerosis. 3. Similar right-sided thyroid nodule.

## 2016-11-08 NOTE — Progress Notes (Signed)
[place]  [phone] [phone]) [phone]  ID: [name] OB: 06-Oct-1946  MR#: [id number]  [id number]  Patient Care Team: Pollyann Samples, MD as PCP - General (Family Medicine)  CHIEF COMPLAINT: Stage IIb clear cell/serous left ovarian cancer.  INTERVAL HISTORY: Patient returns to clinic today for routine six-month follow-up. She was last evaluated by gynecology oncology in May 2017. She has had isolated abdominal cramping in her right upper quadrant, but otherwise has felt well. She has no neurologic complaints. She denies any recent fevers or illnesses. She is a good appetite and denies weight loss. She denies any other pain. She has no chest pain or shortness of breath. She denies any nausea, vomiting, constipation, or diarrhea. She has no urinary complaints. Patient feels at her baseline and offers no further specific complaints today.  REVIEW OF SYSTEMS:   Review of Systems  Constitutional: Negative.  Negative for fever, malaise/fatigue and weight loss.  Respiratory: Negative.  Negative for cough and shortness of breath.   Cardiovascular: Negative.  Negative for chest pain and leg swelling.  Gastrointestinal: Negative.  Negative for abdominal pain.  Genitourinary: Negative.   Neurological: Negative.  Negative for weakness.  Psychiatric/Behavioral: Negative.  The patient is not nervous/anxious.     As per HPI. Otherwise, a complete review of systems is negative.  PAST MEDICAL HISTORY: Past Medical History:  Diagnosis Date  . Arthritis   . Goiter   . Goiter, nodular   . Ovarian cancer ([place])   . Ovarian cancer ([place]) 07/13/2015    PAST SURGICAL HISTORY: Past Surgical History:  Procedure Laterality Date  . ANKLE FRACTURE SURGERY    . LUMBAR FUSION    . ROTATOR CUFF REPAIR      FAMILY HISTORY: Family History  Problem Relation Age of Onset  . Alzheimer's disease Mother   . Tuberculosis Father     ADVANCED DIRECTIVES (Y/N):   N  HEALTH MAINTENANCE: Social History  Substance Use Topics  . Smoking status: Former Smoker    Packs/day: 0.50    Years: 50.00    Types: Cigarettes    Quit date: 10/10/2016  . Smokeless tobacco: Never Used  . Alcohol use No     Colonoscopy:  PAP:  Bone density:  Lipid panel:  Allergies  Allergen Reactions  . Hydrocodone Shortness Of Breath  . Celecoxib Itching and Other (See Comments)    Depression Depression  . Sulfa Antibiotics   . Lorazepam Other (See Comments)    Patient states she wakes up and doesn't know what she is suppose to do that day.     Current Outpatient Prescriptions  Medication Sig Dispense Refill  . albuterol (PROAIR HFA) 108 (90 BASE) MCG/ACT inhaler Inhale 2 puffs into the lungs.    Marland Kitchen albuterol (PROVENTIL) (2.5 MG/3ML) 0.083% nebulizer solution Take 3 mLs (2.5 mg total) by nebulization every 6 (six) hours as needed for wheezing or shortness of breath. 75 mL 12  . calcium carbonate (OS-CAL) 600 MG TABS tablet Take 600 mg by mouth.    . Calcium Carbonate-Vitamin D (CALCIUM-VITAMIN D) 500-200 MG-UNIT tablet Take 1 tablet by mouth daily.    . cholecalciferol (VITAMIN D) 1000 units tablet Take 1,000 Units by mouth daily.    . COMBIGAN 0.2-0.5 % ophthalmic solution PLACE 1 DROP IN LEFT EYE TWICE A DAY  2  . cyanocobalamin 1000 MCG tablet Take 1,000 mcg by mouth daily.    . ergocalciferol (VITAMIN D2) 50000 UNITS capsule Take 1,000 Units by  mouth.    . fenofibrate (TRICOR) 145 MG tablet Take 1 tablet by mouth daily.    Marland Kitchen latanoprost (XALATAN) 0.005 % ophthalmic solution     . Magnesium 250 MG TABS Take by mouth.    . Multiple Vitamin (MULTIVITAMIN) tablet Take 1 tablet by mouth daily.    . naproxen (NAPROSYN) 375 MG tablet TAKE ONE TABLET (375 MG TOTAL) BY MOUTH 2 (TWO) TIMES DAILY WITH MEALS.    . ranitidine (ZANTAC) 150 MG tablet TAKE 1 TABLET TWICE A DAY    . traZODone (DESYREL) 50 MG tablet TAKE ONE TABLET (50 MG TOTAL) BY MOUTH AT BEDTIME.     No  current facility-administered medications for this visit.     OBJECTIVE: Vitals:   11/10/16 1539  Resp: 18  Temp: 98.3 F (36.8 C)     Body mass index is 27.59 kg/m.    ECOG FS:0 - Asymptomatic  General: Well-developed, well-nourished, no acute distress. Eyes: Pink conjunctiva, anicteric sclera. HEENT: Normocephalic, moist mucous membranes, clear oropharnyx. Lungs: Clear to auscultation bilaterally. Heart: Regular rate and rhythm. No rubs, murmurs, or gallops. Abdomen: Soft, nontender, nondistended. No organomegaly noted, normoactive bowel sounds. Musculoskeletal: No edema, cyanosis, or clubbing. Neuro: Alert, answering all questions appropriately. Cranial nerves grossly intact. Skin: No rashes or petechiae noted. Psych: Normal affect. Lymphatics: No cervical, calvicular, axillary or inguinal LAD.   LAB RESULTS:  Lab Results  Component Value Date   NA 135 11/10/2016   K 3.6 11/10/2016   CL 102 11/10/2016   CO2 26 11/10/2016   GLUCOSE 92 11/10/2016   BUN 10 11/10/2016   CREATININE 0.87 11/10/2016   CALCIUM 9.5 11/10/2016   PROT 7.7 11/10/2016   ALBUMIN 4.7 11/10/2016   AST 29 11/10/2016   ALT 9 (L) 11/10/2016   ALKPHOS 59 11/10/2016   BILITOT 0.7 11/10/2016   GFRNONAA >60 11/10/2016   GFRAA >60 11/10/2016    Lab Results  Component Value Date   WBC 8.3 11/10/2016   NEUTROABS 5.0 11/10/2016   HGB 12.4 11/10/2016   HCT 36.7 11/10/2016   MCV 88.0 11/10/2016   PLT 322 11/10/2016   Lab Results  Component Value Date   CA125 8.3 05/07/2016     STUDIES: No results found.  ASSESSMENT: Stage IIb clear cell/serous left ovarian cancer  PLAN:    1. Stage IIb clear cell/serous left ovarian cancer:  Patient underwent surgical debulking following by four cycles of adjuvant carboplatin and taxol completing in approximately August 2013. Follow-up CT scan revealed no evidence of disease. Patient's CA-125 continues to be within normal limits, today's result is pending.  She had a normal gynecologic exam in May 2017. No intervention is needed. No further imaging is necessary unless there is concern of recurrence. Return to clinic in 6 months to see gynecology-oncology and then in one year for further evaluation. 2. Genetic testing: Previously, not pursued secondary to financial reasons. Will follow-up at next clinic visit.  Patient expressed understanding and was in agreement with this plan. She also understands that She can call clinic at any time with any questions, concerns, or complaints.   Ovarian cancer [facility])   Staging form: Ovary, AJCC 7th Edition   - Clinical stage from 04/07/2012: Stage I (T1, N0, M0) - Signed by Evlyn Kanner, NP on 05/07/2016  Lloyd Huger, MD   11/10/2016 4:21 PM

## 2016-11-10 ENCOUNTER — Inpatient Hospital Stay: Payer: Medicare HMO | Attending: Oncology

## 2016-11-10 ENCOUNTER — Encounter: Payer: Self-pay | Admitting: Oncology

## 2016-11-10 ENCOUNTER — Inpatient Hospital Stay (HOSPITAL_BASED_OUTPATIENT_CLINIC_OR_DEPARTMENT_OTHER): Payer: Medicare HMO | Admitting: Oncology

## 2016-11-10 VITALS — Temp 98.3°F | Resp 18 | Wt 181.4 lb

## 2016-11-10 DIAGNOSIS — Z9221 Personal history of antineoplastic chemotherapy: Secondary | ICD-10-CM | POA: Insufficient documentation

## 2016-11-10 DIAGNOSIS — Z79899 Other long term (current) drug therapy: Secondary | ICD-10-CM | POA: Diagnosis not present

## 2016-11-10 DIAGNOSIS — Z8543 Personal history of malignant neoplasm of ovary: Secondary | ICD-10-CM | POA: Insufficient documentation

## 2016-11-10 DIAGNOSIS — C562 Malignant neoplasm of left ovary: Secondary | ICD-10-CM

## 2016-11-10 DIAGNOSIS — R1011 Right upper quadrant pain: Secondary | ICD-10-CM

## 2016-11-10 DIAGNOSIS — Z87891 Personal history of nicotine dependence: Secondary | ICD-10-CM | POA: Diagnosis not present

## 2016-11-10 DIAGNOSIS — M1990 Unspecified osteoarthritis, unspecified site: Secondary | ICD-10-CM

## 2016-11-10 LAB — CBC WITH DIFFERENTIAL/PLATELET
BASOS PCT: 1 %
Basophils Absolute: 0.1 10*3/uL (ref 0–0.1)
EOS ABS: 0.4 10*3/uL (ref 0–0.7)
Eosinophils Relative: 5 %
HEMATOCRIT: 36.7 % (ref 35.0–47.0)
Hemoglobin: 12.4 g/dL (ref 12.0–16.0)
Lymphocytes Relative: 26 %
Lymphs Abs: 2.2 10*3/uL (ref 1.0–3.6)
MCH: 29.7 pg (ref 26.0–34.0)
MCHC: 33.8 g/dL (ref 32.0–36.0)
MCV: 88 fL (ref 80.0–100.0)
MONO ABS: 0.6 10*3/uL (ref 0.2–0.9)
MONOS PCT: 7 %
Neutro Abs: 5 10*3/uL (ref 1.4–6.5)
Neutrophils Relative %: 61 %
Platelets: 322 10*3/uL (ref 150–440)
RBC: 4.17 MIL/uL (ref 3.80–5.20)
RDW: 13.3 % (ref 11.5–14.5)
WBC: 8.3 10*3/uL (ref 3.6–11.0)

## 2016-11-10 LAB — COMPREHENSIVE METABOLIC PANEL
ALT: 9 U/L — AB (ref 14–54)
AST: 29 U/L (ref 15–41)
Albumin: 4.7 g/dL (ref 3.5–5.0)
Alkaline Phosphatase: 59 U/L (ref 38–126)
Anion gap: 7 (ref 5–15)
BUN: 10 mg/dL (ref 6–20)
CHLORIDE: 102 mmol/L (ref 101–111)
CO2: 26 mmol/L (ref 22–32)
CREATININE: 0.87 mg/dL (ref 0.44–1.00)
Calcium: 9.5 mg/dL (ref 8.9–10.3)
GFR calc Af Amer: >60 mL/min (ref >60)
Glucose, Bld: 92 mg/dL (ref 65–99)
Potassium: 3.6 mmol/L (ref 3.5–5.1)
Sodium: 135 mmol/L (ref 135–145)
Total Bilirubin: 0.7 mg/dL (ref 0.3–1.2)
Total Protein: 7.7 g/dL (ref 6.5–8.1)

## 2016-11-11 LAB — CA 125: CA 125: 10.9 U/mL (ref 0.0–38.1)

## 2017-05-19 ENCOUNTER — Other Ambulatory Visit: Payer: Self-pay

## 2017-05-19 ENCOUNTER — Inpatient Hospital Stay: Payer: Medicare HMO

## 2017-05-19 ENCOUNTER — Inpatient Hospital Stay: Payer: Medicare HMO | Attending: Obstetrics and Gynecology | Admitting: Obstetrics and Gynecology

## 2017-05-19 ENCOUNTER — Encounter: Payer: Self-pay | Admitting: Obstetrics and Gynecology

## 2017-05-19 VITALS — BP 130/78 | HR 87 | Temp 99.0°F | Resp 18 | Ht 68.0 in | Wt 175.9 lb

## 2017-05-19 DIAGNOSIS — J449 Chronic obstructive pulmonary disease, unspecified: Secondary | ICD-10-CM | POA: Insufficient documentation

## 2017-05-19 DIAGNOSIS — H409 Unspecified glaucoma: Secondary | ICD-10-CM | POA: Diagnosis not present

## 2017-05-19 DIAGNOSIS — Z8543 Personal history of malignant neoplasm of ovary: Secondary | ICD-10-CM | POA: Diagnosis not present

## 2017-05-19 DIAGNOSIS — F329 Major depressive disorder, single episode, unspecified: Secondary | ICD-10-CM | POA: Insufficient documentation

## 2017-05-19 DIAGNOSIS — F17210 Nicotine dependence, cigarettes, uncomplicated: Secondary | ICD-10-CM | POA: Insufficient documentation

## 2017-05-19 DIAGNOSIS — E559 Vitamin D deficiency, unspecified: Secondary | ICD-10-CM | POA: Insufficient documentation

## 2017-05-19 DIAGNOSIS — Z9221 Personal history of antineoplastic chemotherapy: Secondary | ICD-10-CM | POA: Diagnosis not present

## 2017-05-19 DIAGNOSIS — C569 Malignant neoplasm of unspecified ovary: Secondary | ICD-10-CM

## 2017-05-19 DIAGNOSIS — E7801 Familial hypercholesterolemia: Secondary | ICD-10-CM | POA: Diagnosis not present

## 2017-05-19 DIAGNOSIS — R918 Other nonspecific abnormal finding of lung field: Secondary | ICD-10-CM | POA: Diagnosis not present

## 2017-05-19 DIAGNOSIS — R32 Unspecified urinary incontinence: Secondary | ICD-10-CM | POA: Diagnosis not present

## 2017-05-19 NOTE — Patient Instructions (Signed)
Kegel Exercises Kegel exercises help strengthen the muscles that support the rectum, vagina, small intestine, bladder, and uterus. Doing Kegel exercises can help:  Improve bladder and bowel control.  Improve sexual response.  Reduce problems and discomfort during pregnancy. Kegel exercises involve squeezing your pelvic floor muscles, which are the same muscles you squeeze when you try to stop the flow of urine. The exercises can be done while sitting, standing, or lying down, but it is best to vary your position. Phase 1 exercises 1. Squeeze your pelvic floor muscles tight. You should feel a tight lift in your rectal area. If you are a female, you should also feel a tightness in your vaginal area. Keep your stomach, buttocks, and legs relaxed. 2. Hold the muscles tight for up to 10 seconds. 3. Relax your muscles. Repeat this exercise 50 times a day or as many times as told by your health care provider. Continue to do this exercise for at least 4-6 weeks or for as long as told by your health care provider. This information is not intended to replace advice given to you by your health care provider. Make sure you discuss any questions you have with your health care provider. Document Released: 11/23/2012 Document Revised: 08/01/2016 Document Reviewed: 10/27/2015 Elsevier Interactive Patient Education  2017 [employer].

## 2017-05-19 NOTE — Progress Notes (Signed)
  Oncology Nurse Navigator Documentation Chaperoned pelvic exam. Follow up in 6 months with Dr. Grayland Ormond and Dr. Theora Gianotti in one year. Navigator Location: [facility] (05/19/17 1400)   )Navigator Encounter Type: Follow-up Appt (05/19/17 1400)                     Patient Visit Type: GynOnc (05/19/17 1400)                              Time Spent with Patient: 15 (05/19/17 1400)

## 2017-05-19 NOTE — Progress Notes (Signed)
Gynecologic Oncology Interval Note  Referring Provider: Dr. Georgianne Fick and Dr. Oliva Bustard  Chief Concern: Ovarian cancer surveillance.   Subjective:  TANISA LAGACE is a 71 y.o. woman who presents today for continued surveillance for history of stage IB clear cell/serous ovarian cancer.   No new complaints today. Denies GI/GU symptoms. She does have depression and mild urinary incontinence. She was last seen by Dr. Grayland Ormond 11/10/2016 and had a negative exam. She was just seen by me on 05/20/2016 and and had a negative pelvic exam.     Lab Results  Component Value Date   CA125 10.9 11/10/2016    Since she was last seen she had a CT scan on 09/21/2016  IMPRESSION: 1. Lung-RADS Category 2, benign appearance or behavior. Continue annual screening with low-dose chest CT without contrast in 12 months. 2.  Coronary artery atherosclerosis. Aortic atherosclerosis. 3. Similar right-sided thyroid nodule.   Oncology Treatment History:   03/2012   presented with 10 cm pelvic mass and normal CA125 (16.2).  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  TAH/BSO and complete staging with four negative left pelvic nodes, 6 negative right pelvic nodes, 1 negative aortic node, negative omentum (Dr Sabra Heck and Dr Star Age). Carboplatin and paclitaxel x 4 with Dr Oliva Bustard, completed NED 07/2012  03/2014   back pain, but CT scan abd/pelvis negative 06/20/14    CA125 = 6.3 08/14/14  CT scan abd/pelvis negative 02/22/2015 Chest CT   6 mm irregular nodule in the posterior right lung apex. Lung-RADS  Category 3, probably benign findings. Short-term follow up in 6  months is recommended with low-dose chest CT without contrast.  Additional small right upper lobe nodules measuring 3-4 mm.   09/12/2015 CT chest 1. Interval stability of three right upper lobe pulmonary nodules, largest 6 mm, for which six month stability has been demonstrated. Please note that the patient had  a low dose lung screening chest CT on 02/22/2015. If the patient continues to qualify for low dose lung screening chest CT, a repeat low dose lung screening chest CT is advised in 12 months. Otherwise, a follow-up routine unenhanced chest CT is advised in 12-18 months. This recommendation follows the consensus statement: Guidelines for Management of Small Pulmonary Nodules Detected on CT Scans: A Statement from the [place] as published in Radiology 2005;237:395-400. 2. Mild-to-moderate centrilobular emphysema and diffuse bronchial wall thickening, suggesting COPD. 3. Atherosclerosis, including left main and 3 vessel coronary artery disease. Please note that although the presence of coronary artery calcium documents the presence of coronary artery disease, the severity of this disease and any potential stenosis cannot be assessed on this non-gated CT examination. Assessment for potential risk factor modification, dietary therapy or pharmacologic therapy may be warranted, if clinically indicated. 4. Stable multinodular goiter, with a dominant 1.5 cm right thyroid lobe hypodense nodule. Consider correlation with thyroid sonography.  05/05/2016 thyroid ultrasound IMPRESSION:multinodular thyroid. Majority of nodules do not meet criteria for Biopsy. Single right thyroid nodule is suspicious only by size, measuring 1.8 cm, with otherwise benign features. Given the patient's age, either six-month to 81 month surveillance or biopsy may be considered, as suggested by current SRU guidelines.   Genetic testing: Previously, not pursued secondary to financial reasons.Dr. Grayland Ormond plans to follow-up at next clinic visit.  Problem List: Patient Active Problem List   Diagnosis Date Noted  . History of ovarian cancer 05/19/2017  . Lung nodules 05/19/2017  . Chronic obstructive pulmonary disease ([place]) 11/11/2015  .  Airway hyperreactivity 07/13/2015  . CAFL (chronic airflow limitation) ([place])  07/13/2015  . Clinical depression 07/13/2015  . Fatigue 07/13/2015  . Glaucoma 07/13/2015  . Avitaminosis D 07/13/2015  . Ovarian cancer ([place]) 07/13/2015  . 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 Arthritis 12/05/2013  . 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 Breath shortness 10/28/2012  . Compulsive tobacco user syndrome 10/06/2012  . Current tobacco use 10/06/2012    Past Medical History: Past Medical History:  Diagnosis Date  . Arthritis   . Depression   . Goiter   . Goiter, nodular   . Ovarian cancer ([place])   . Ovarian cancer ([place]) 07/13/2015    Past Surgical History: Past Surgical History:  Procedure Laterality Date  . ANKLE FRACTURE SURGERY    . LUMBAR FUSION    . ROTATOR CUFF REPAIR      Family History: Family History  Problem Relation Age of Onset  . Alzheimer's disease Mother   . Tuberculosis Father     Social History: Social History   Social History  . Marital status: Widowed    Spouse name: N/A  . Number of children: N/A  . Years of education: N/A   Occupational History  . Not on file.   Social History Main Topics  . Smoking status: Current Every Day Smoker    Packs/day: 0.50    Years: 50.00    Types: Cigarettes    Last attempt to quit: 10/10/2016  . Smokeless tobacco: Never Used     Comment: started bacl several months ago  . Alcohol use No  . Drug use: No  . Sexual activity: Not on file   Other Topics Concern  . Not on file   Social History Narrative  . No narrative on file    Allergies: Allergies  Allergen Reactions  . Hydrocodone Shortness Of Breath  . Celecoxib Itching and Other (See Comments)    Depression Depression  . Sulfa Antibiotics     Current Medications: Current Outpatient Prescriptions  Medication Sig Dispense Refill  . albuterol (PROAIR HFA) 108 (90 BASE) MCG/ACT inhaler Inhale 2 puffs into the lungs.    Marland Kitchen albuterol (PROVENTIL) (2.5 MG/3ML) 0.083% nebulizer solution  Take 3 mLs (2.5 mg total) by nebulization every 6 (six) hours as needed for wheezing or shortness of breath. 75 mL 12  . Calcium Carbonate-Vitamin D (CALCIUM-VITAMIN D) 500-200 MG-UNIT tablet Take 1 tablet by mouth daily.    . COMBIGAN 0.2-0.5 % ophthalmic solution PLACE 1 DROP IN LEFT EYE TWICE A DAY  2  . cyanocobalamin 1000 MCG tablet Take 1,000 mcg by mouth daily.    . ergocalciferol (VITAMIN D2) 50000 UNITS capsule Take 1,000 Units by mouth.    . fenofibrate (TRICOR) 145 MG tablet Take 1 tablet by mouth daily.    Marland Kitchen latanoprost (XALATAN) 0.005 % ophthalmic solution     . Melatonin 5 MG CAPS Take 1 capsule by mouth daily as needed.    . metroNIDAZOLE (FLAGYL) 500 MG tablet Take 500 mg by mouth 2 (two) times daily.    . Multiple Vitamin (MULTIVITAMIN) tablet Take 1 tablet by mouth daily.    . naproxen (NAPROSYN) 375 MG tablet TAKE ONE TABLET (375 MG TOTAL) BY MOUTH 2 (TWO) TIMES DAILY WITH MEALS.    Marland Kitchen omeprazole (PRILOSEC) 20 MG capsule Take 20 mg by mouth daily.    . ranitidine (ZANTAC) 150 MG tablet TAKE 1 TABLET TWICE A DAY     No current facility-administered  medications for this visit.     Review of Systems General: no complaints  HEENT: no complaints  Lungs: negative  Cardiac: no complaints  GI: no complaints  GU: mild UI  Musculoskeletal: no complaints  Extremities: negative  Skin: no complaints  Neuro: depression and difficulty sleeping  Endocrine: no complaints  Psych: depression      Objective:  BP 130/78   Pulse 87   Temp 99 F (37.2 C) (Tympanic)   Resp 18   Ht 5\' 8"  (1.727 m)   Wt 175 lb 14.4 oz (79.8 kg)   BMI 26.75 kg/m    ECOG Performance Status: 0 - Asymptomatic    General appearance: alert, cooperative and appears stated age HEENT:PERRLA, sclera clear, anicteric,  CV: RRR Lungs: BCTA Abdomen: soft, no masses or hernias. Non tender. Lymph node survey: negative AX, [place], inguinal nodes Extremities: no edema or lesions  Neurological exam reveals:  alert, oriented, normal speech, no focal findings or movement disorder noted.  Pelvic: exam chaperoned by nurse;  Vulva: normal appearing vulva with no masses, tenderness or lesions, long vaginal length; Vagina: normal; Cervix/Uterus: surgically absent;  Unable to palpate the top of the vaginal. Limited exam due to anatomy and discomfort. Rectal: deferred  Lab Review CA-125 pending. Lab Results  Component Value Date   CA125 10.9 11/10/2016   CA125 8.3 05/07/2016   CA125 8.9 11/07/2015    Assessment:  OLEVA KOO is a 71 y.o. 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  Pulmonary nodules.  Mild Urinary incontinence.   Tobacco usage.    Depression  Body mass index is 26.75 kg/m.   Plan:   Problem List Items Addressed This Visit      Other   History of ovarian cancer - Primary   Lung nodules      We will continue to alternate visit withDr. Delight Hoh every 6 month intervals. Plan for CA125 today and every 6 months or until 5 years and then annually with both Dr. Grayland Ormond and myself. We will defer management of her pulmonary nodules to Dr. Grayland Ormond.   We discussed the use of Kegel exercises.    She will call in the interval with any concerning symptoms.  CT scan can be performed if any rise in CA125 or concerning signs or symptoms. She is comfortable with the plan and had her questions answered.    She is interested in genetic testing but is concerned about the financial aspects.  The patient will speak with her daughter about genetic testing and if she had that done.   With regard to her other medical issues we recommended she follow up with her PCP. She is on anti-depressants and we offered her referral to the [place] counseling which she declined.   We also provided information regarding smoking cessation.    Gillis Ends, MD  CC:  Dr. Georgianne Fick and Dr.  Oliva Bustard

## 2017-05-19 NOTE — Progress Notes (Signed)
Pt only complaint was not sleeping well , and she has depression. No gyn concerns.

## 2017-05-20 LAB — CA 125: CA 125: 9.3 U/mL (ref 0.0–38.1)

## 2017-06-02 ENCOUNTER — Telehealth: Payer: Self-pay

## 2017-06-02 NOTE — Telephone Encounter (Signed)
  Oncology Nurse Navigator Documentation Notified of CA 125 results 9.3 Navigator Location: [facility] (06/02/17 1200)   )Navigator Encounter Type: Telephone;Diagnostic Results (06/02/17 1200) Telephone: Outgoing Call;Diagnostic Results (06/02/17 1200)                                                  Time Spent with Patient: 15 (06/02/17 1200)

## 2017-09-27 ENCOUNTER — Telehealth: Payer: Self-pay | Admitting: *Deleted

## 2017-09-27 NOTE — Telephone Encounter (Signed)
Left message for patient to notify them that it is time to schedule annual low dose lung cancer screening CT scan. Instructed patient to call back to verify information prior to the scan being scheduled.  

## 2017-10-12 ENCOUNTER — Telehealth: Payer: Self-pay | Admitting: *Deleted

## 2017-10-12 NOTE — Telephone Encounter (Signed)
Left message for patient to notify them that it is time to schedule annual low dose lung cancer screening CT scan. Instructed patient to call back to verify information prior to the scan being scheduled.  

## 2017-10-27 ENCOUNTER — Encounter: Payer: Self-pay | Admitting: Oncology

## 2017-10-27 ENCOUNTER — Telehealth: Payer: Self-pay | Admitting: Oncology

## 2017-10-27 NOTE — Telephone Encounter (Signed)
Ok

## 2017-10-27 NOTE — Telephone Encounter (Signed)
Pt did not want to r\s appt. Stated she did not remember the facilty or Dr. Grayland Ormond and she needed to do some more research before coming in. I informed pt that I would mail her a letter so she could contact us if she did want to r\s in the near future.

## 2017-11-09 ENCOUNTER — Ambulatory Visit: Payer: Medicare HMO | Admitting: Oncology

## 2017-11-09 ENCOUNTER — Other Ambulatory Visit: Payer: Medicare HMO

## 2017-11-09 ENCOUNTER — Encounter: Payer: Self-pay | Admitting: *Deleted

## 2018-05-18 ENCOUNTER — Inpatient Hospital Stay: Payer: Medicare HMO

## 2018-05-25 ENCOUNTER — Telehealth: Payer: Self-pay

## 2018-05-25 NOTE — Telephone Encounter (Signed)
[name] did not show for her one year follow up with Gyn Onc. Scheduling message sent to contact her for reschedule. Oncology Nurse Navigator Documentation  Navigator Location: [facility] (05/25/18 1300)   )Navigator Encounter Type: Other;Follow-up Appt (05/25/18 1300)                                                    Time Spent with Patient: 15 (05/25/18 1300)

## 2018-06-17 ENCOUNTER — Telehealth: Payer: Self-pay | Admitting: Obstetrics and Gynecology

## 2018-06-17 NOTE — Telephone Encounter (Signed)
None of the numbers listed are in working order. Mailed out new appt card due to provider being in Surgery on 7/3 per scheduling message from [place].

## 2018-06-22 ENCOUNTER — Other Ambulatory Visit: Payer: Self-pay

## 2018-06-22 ENCOUNTER — Ambulatory Visit: Payer: Self-pay

## 2018-07-20 ENCOUNTER — Inpatient Hospital Stay: Payer: Medicare HMO

## 2018-07-20 ENCOUNTER — Inpatient Hospital Stay: Payer: Medicare HMO | Attending: Obstetrics and Gynecology

## 2018-07-20 NOTE — Progress Notes (Deleted)
Gynecologic Oncology Interval Note  Referring Provider: Dr. Georgianne Fick and Dr. Oliva Bustard  Chief Concern: Ovarian cancer surveillance  Subjective:  [name] is a 72 y.o. woman who presents today for continued surveillance for history of stage IB clear cell/serous ovarian cancer.   She was last seen in clinic by Dr. Theora Gianotti on 05/19/17 with a negative exam. We discussed alternating visits with Dr. Grayland Ormond every 6 months and monitoring of CA125 for 5 years then annually thereafter. She completed treatment 07/2012. She has not pursued genetic testing due to financial concerns.   Today, ***.   Oncology Treatment History:   03/2012   presented with 10 cm pelvic mass and normal CA125 (16.2).  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  TAH/BSO and complete staging with four negative left pelvic nodes, 6 negative right pelvic nodes, 1 negative aortic node, negative omentum (Dr Sabra Heck and Dr Star Age). Carboplatin and paclitaxel x 4 with Dr Oliva Bustard, completed NED 07/2012  03/2014   back pain, but CT scan abd/pelvis negative 06/20/14    CA125 = 6.3 08/14/14  CT scan abd/pelvis negative 02/22/2015 Chest CT   6 mm irregular nodule in the posterior right lung apex. Lung-RADS  Category 3, probably benign findings. Short-term follow up in 6  months is recommended with low-dose chest CT without contrast.  Additional small right upper lobe nodules measuring 3-4 mm.   09/12/2015 CT chest 1. Interval stability of three right upper lobe pulmonary nodules, largest 6 mm, for which six month stability has been demonstrated. Please note that the patient had a low dose lung screening chest CT on 02/22/2015. If the patient continues to qualify for low dose lung screening chest CT, a repeat low dose lung screening chest CT is advised in 12 months. Otherwise, a follow-up routine unenhanced chest CT is advised in 12-18 months. This recommendation follows the consensus  statement: Guidelines for Management of Small Pulmonary Nodules Detected on CT Scans: A Statement from the [place] as published in Radiology 2005;237:395-400. 2. Mild-to-moderate centrilobular emphysema and diffuse bronchial wall thickening, suggesting COPD. 3. Atherosclerosis, including left main and 3 vessel coronary artery disease. Please note that although the presence of coronary artery calcium documents the presence of coronary artery disease, the severity of this disease and any potential stenosis cannot be assessed on this non-gated CT examination. Assessment for potential risk factor modification, dietary therapy or pharmacologic therapy may be warranted, if clinically indicated. 4. Stable multinodular goiter, with a dominant 1.5 cm right thyroid lobe hypodense nodule. Consider correlation with thyroid sonography.  05/05/2016 thyroid ultrasound IMPRESSION:multinodular thyroid. Majority of nodules do not meet criteria for Biopsy. Single right thyroid nodule is suspicious only by size, measuring 1.8 cm, with otherwise benign features. Given the patient's age, either six-month to 23 month surveillance or biopsy may be considered, as suggested by current SRU guidelines.  She was just seen by Dr. Theora Gianotti on 05/20/2016 and and had a negative pelvic exam.   Lab Results  Component Value Date   CA125 9.3 05/19/2017   CT - 09/21/2016  IMPRESSION: 1. Lung-RADS Category 2, benign appearance or behavior. Continue annual screening with low-dose chest CT without contrast in 12 months. 2.  Coronary artery atherosclerosis. Aortic atherosclerosis. 3. Similar right-sided thyroid nodule.    Genetic testing: Previously, not pursued secondary to financial reasons.Dr. Grayland Ormond plans to follow-up at next clinic visit.  Problem List: Patient Active Problem List   Diagnosis Date Noted  . History of ovarian  cancer 05/19/2017  . Lung nodules 05/19/2017  . Chronic obstructive pulmonary  disease ([place]) 11/11/2015  . Airway hyperreactivity 07/13/2015  . CAFL (chronic airflow limitation) ([place]) 07/13/2015  . Clinical depression 07/13/2015  . Fatigue 07/13/2015  . Glaucoma 07/13/2015  . Avitaminosis D 07/13/2015  . Ovarian cancer ([place]) 07/13/2015  . 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 Arthritis 12/05/2013  . 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 Breath shortness 10/28/2012  . Compulsive tobacco user syndrome 10/06/2012  . Current tobacco use 10/06/2012    Past Medical History: Past Medical History:  Diagnosis Date  . Arthritis   . Depression   . Goiter   . Goiter, nodular   . Ovarian cancer ([place])   . Ovarian cancer ([place]) 07/13/2015    Past Surgical History: Past Surgical History:  Procedure Laterality Date  . ANKLE FRACTURE SURGERY    . LUMBAR FUSION    . ROTATOR CUFF REPAIR      Family History: Family History  Problem Relation Age of Onset  . Alzheimer's disease Mother   . Tuberculosis Father     Social History: Social History   Socioeconomic History  . Marital status: Widowed    Spouse name: Not on file  . Number of children: Not on file  . Years of education: Not on file  . Highest education level: Not on file  Occupational History  . Not on file  Social Needs  . Financial resource strain: Not on file  . Food insecurity:    Worry: Not on file    Inability: Not on file  . Transportation needs:    Medical: Not on file    Non-medical: Not on file  Tobacco Use  . Smoking status: Current Every Day Smoker    Packs/day: 0.50    Years: 50.00    Pack years: 25.00    Types: Cigarettes    Last attempt to quit: 10/10/2016    Years since quitting: 1.7  . Smokeless tobacco: Never Used  . Tobacco comment: started bacl several months ago  Substance and Sexual Activity  . Alcohol use: No  . Drug use: No  . Sexual activity: Not on file  Lifestyle  . Physical activity:    Days per week: Not on file     Minutes per session: Not on file  . Stress: Not on file  Relationships  . Social connections:    Talks on phone: Not on file    Gets together: Not on file    Attends religious service: Not on file    Active member of club or organization: Not on file    Attends meetings of clubs or organizations: Not on file    Relationship status: Not on file  . Intimate partner violence:    Fear of current or ex partner: Not on file    Emotionally abused: Not on file    Physically abused: Not on file    Forced sexual activity: Not on file  Other Topics Concern  . Not on file  Social History Narrative  . Not on file    Allergies: Allergies  Allergen Reactions  . Hydrocodone Shortness Of Breath  . Celecoxib Itching and Other (See Comments)    Depression Depression  . Sulfa Antibiotics     Current Medications: Current Outpatient Medications  Medication Sig Dispense Refill  . albuterol (PROAIR HFA) 108 (90 BASE) MCG/ACT inhaler Inhale 2 puffs into the lungs.    Marland Kitchen albuterol (PROVENTIL) (2.5  MG/3ML) 0.083% nebulizer solution Take 3 mLs (2.5 mg total) by nebulization every 6 (six) hours as needed for wheezing or shortness of breath. 75 mL 12  . Calcium Carbonate-Vitamin D (CALCIUM-VITAMIN D) 500-200 MG-UNIT tablet Take 1 tablet by mouth daily.    . COMBIGAN 0.2-0.5 % ophthalmic solution PLACE 1 DROP IN LEFT EYE TWICE A DAY  2  . cyanocobalamin 1000 MCG tablet Take 1,000 mcg by mouth daily.    . ergocalciferol (VITAMIN D2) 50000 UNITS capsule Take 1,000 Units by mouth.    . fenofibrate (TRICOR) 145 MG tablet Take 1 tablet by mouth daily.    Marland Kitchen latanoprost (XALATAN) 0.005 % ophthalmic solution     . Melatonin 5 MG CAPS Take 1 capsule by mouth daily as needed.    . metroNIDAZOLE (FLAGYL) 500 MG tablet Take 500 mg by mouth 2 (two) times daily.    . Multiple Vitamin (MULTIVITAMIN) tablet Take 1 tablet by mouth daily.    . naproxen (NAPROSYN) 375 MG tablet TAKE ONE TABLET (375 MG TOTAL) BY MOUTH 2  (TWO) TIMES DAILY WITH MEALS.    Marland Kitchen omeprazole (PRILOSEC) 20 MG capsule Take 20 mg by mouth daily.    . ranitidine (ZANTAC) 150 MG tablet TAKE 1 TABLET TWICE A DAY     No current facility-administered medications for this visit.     Review of Systems General: no complaints  HEENT: no complaints  Lungs: negative  Cardiac: no complaints  GI: no complaints  GU: mild UI  Musculoskeletal: no complaints  Extremities: negative  Skin: no complaints  Neuro: depression and difficulty sleeping  Endocrine: no complaints  Psych: depression      Objective:  There were no vitals taken for this visit.   ECOG Performance Status: 0 - Asymptomatic    General appearance: alert, cooperative and appears stated age HEENT:PERRLA, sclera clear, anicteric,  CV: RRR Lungs: BCTA Abdomen: soft, no masses or hernias. Non tender. Lymph node survey: negative AX, [place], inguinal nodes Extremities: no edema or lesions  Neurological exam reveals: alert, oriented, normal speech, no focal findings or movement disorder noted.  Pelvic: exam chaperoned by nurse;  Vulva: normal appearing vulva with no masses, tenderness or lesions, long vaginal length; Vagina: normal; Cervix/Uterus: surgically absent;  Unable to palpate the top of the vaginal. Limited exam due to anatomy and discomfort. Rectal: deferred  Lab Review CA-125 pending. Lab Results  Component Value Date   CA125 9.3 05/19/2017   CA125 10.9 11/10/2016   CA125 8.3 05/07/2016    Assessment:  [name] is a 72 y.o. 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  Pulmonary nodules.  Mild Urinary incontinence.   Tobacco usage.    Depression  There is no height or weight on file to calculate BMI.   Plan:   Problem List Items Addressed This Visit    None      We will continue to alternate visit withDr. Delight Hoh every 6 month intervals. Plan  for CA125 today and every 6 months or until 5 years and then annually with both Dr. Grayland Ormond and myself. We will defer management of her pulmonary nodules to Dr. Grayland Ormond.   We discussed the use of Kegel exercises.    She will call in the interval with any concerning symptoms.  CT scan can be performed if any rise in CA125 or concerning signs or symptoms. She is comfortable with the plan and  had her questions answered.    She is interested in genetic testing but is concerned about the financial aspects.  The patient will speak with her daughter about genetic testing and if she had that done.   With regard to her other medical issues we recommended she follow up with her PCP. She is on anti-depressants and we offered her referral to the [place] counseling which she declined.   We also provided information regarding smoking cessation.    Verlon Au, NP  CC:  Dr. Georgianne Fick and Dr. Oliva Bustard

## 2018-07-26 ENCOUNTER — Telehealth: Payer: Self-pay

## 2018-07-26 NOTE — Telephone Encounter (Signed)
Voicemail left on cell number listed in care everywhere records. [phone]. Asked to return call. [name] has missed appointments for follow up at the cancer center. We have been unable to contact. Per PCP notes she has been receiving cancer care at the [facility]. I have contacted [facility] and she is not a patient at that facility. I was directed the the cancer center at [facility], [facility]. I have spoken with a nurse navigator there and am awaiting call back, I would like to confirm that [name] is indeed receiving cancer follow up somewhere. Oncology Nurse Navigator Documentation  Navigator Location: [facility] (07/26/18 1300)   )Navigator Encounter Type: Telephone (07/26/18 1300) Telephone: [place] Call (07/26/18 1300)                                                  Time Spent with Patient: 15 (07/26/18 1300)

## 2018-08-01 ENCOUNTER — Telehealth: Payer: Self-pay

## 2018-08-01 NOTE — Telephone Encounter (Signed)
Call placed to [name]. I was able to reach her regarding missing several cancer center appointments. She cannot recall why she had to go to the cancer center. I informed her that she was overdue for her yearly surveillance for history of ovarian cancer. I noted that her PCP documented that she received her care at [facility]. I called there and they had no record of her coming there. I also called [facility] cancer center and never heard back. We have followed her for several years.  I would like to make sure she is getting her cancer care follow up somewhere. [name] states she cannot come this far to [facility] for continued care. I will route this note to her PCP, Elby Beck, and see if she can get her in with a cancer center more local to her home. [name] reports financial issues and is not sure if she can afford follow up. She has noted memory issues. Oncology Nurse Navigator Documentation  Navigator Location: [facility] (08/01/18 1200)   )Navigator Encounter Type: Telephone (08/01/18 1200) Telephone: Outgoing Call;Patient Update (08/01/18 1200)                                                  Time Spent with Patient: 30 (08/01/18 1200)
